# Patient Record
Sex: Female | Born: 1980 | Race: Black or African American | Hispanic: No | Marital: Single | Smoking: Never smoker
Health system: Southern US, Community
[De-identification: ages and names within clinical notes are randomized; demographics above are authoritative.]

## PROBLEM LIST (undated history)

## (undated) DIAGNOSIS — K219 Gastro-esophageal reflux disease without esophagitis: Secondary | ICD-10-CM

## (undated) DIAGNOSIS — K603 Anal fistula, unspecified: Secondary | ICD-10-CM

## (undated) DIAGNOSIS — F32A Depression, unspecified: Secondary | ICD-10-CM

## (undated) DIAGNOSIS — R7611 Nonspecific reaction to tuberculin skin test without active tuberculosis: Secondary | ICD-10-CM

## (undated) DIAGNOSIS — F329 Major depressive disorder, single episode, unspecified: Secondary | ICD-10-CM

## (undated) DIAGNOSIS — K5090 Crohn's disease, unspecified, without complications: Secondary | ICD-10-CM

## (undated) DIAGNOSIS — D649 Anemia, unspecified: Secondary | ICD-10-CM

## (undated) DIAGNOSIS — R011 Cardiac murmur, unspecified: Secondary | ICD-10-CM

## (undated) DIAGNOSIS — F419 Anxiety disorder, unspecified: Secondary | ICD-10-CM

## (undated) DIAGNOSIS — Z5189 Encounter for other specified aftercare: Secondary | ICD-10-CM

## (undated) HISTORY — DX: Anal fistula, unspecified: K60.30

## (undated) HISTORY — DX: Depression, unspecified: F32.A

## (undated) HISTORY — DX: Anxiety disorder, unspecified: F41.9

## (undated) HISTORY — DX: Cardiac murmur, unspecified: R01.1

## (undated) HISTORY — DX: Major depressive disorder, single episode, unspecified: F32.9

## (undated) HISTORY — DX: Anal fistula: K60.3

## (undated) HISTORY — PX: OTHER SURGICAL HISTORY: SHX169

## (undated) HISTORY — DX: Anemia, unspecified: D64.9

## (undated) HISTORY — PX: COLONOSCOPY: SHX174

## (undated) HISTORY — DX: Gastro-esophageal reflux disease without esophagitis: K21.9

## (undated) HISTORY — DX: Encounter for other specified aftercare: Z51.89

## (undated) HISTORY — PX: ILEOSTOMY: SHX1783

## (undated) HISTORY — DX: Nonspecific reaction to tuberculin skin test without active tuberculosis: R76.11

## (undated) HISTORY — DX: Crohn's disease, unspecified, without complications: K50.90

## (undated) HISTORY — PX: DILATION AND CURETTAGE OF UTERUS: SHX78

---

## 2013-08-09 ENCOUNTER — Encounter: Payer: Self-pay | Admitting: Internal Medicine

## 2013-09-27 ENCOUNTER — Encounter: Payer: Self-pay | Admitting: Internal Medicine

## 2013-10-01 ENCOUNTER — Telehealth: Payer: Self-pay | Admitting: Internal Medicine

## 2013-10-01 ENCOUNTER — Ambulatory Visit (INDEPENDENT_AMBULATORY_CARE_PROVIDER_SITE_OTHER): Payer: BC Managed Care – PPO | Admitting: Internal Medicine

## 2013-10-01 ENCOUNTER — Encounter: Payer: Self-pay | Admitting: Internal Medicine

## 2013-10-01 ENCOUNTER — Ambulatory Visit (INDEPENDENT_AMBULATORY_CARE_PROVIDER_SITE_OTHER): Payer: BC Managed Care – PPO

## 2013-10-01 VITALS — BP 90/76 | HR 84 | Ht 65.0 in | Wt 118.2 lb

## 2013-10-01 DIAGNOSIS — K5090 Crohn's disease, unspecified, without complications: Secondary | ICD-10-CM

## 2013-10-01 DIAGNOSIS — Z9889 Other specified postprocedural states: Secondary | ICD-10-CM

## 2013-10-01 DIAGNOSIS — K5010 Crohn's disease of large intestine without complications: Secondary | ICD-10-CM

## 2013-10-01 DIAGNOSIS — Z9049 Acquired absence of other specified parts of digestive tract: Secondary | ICD-10-CM | POA: Insufficient documentation

## 2013-10-01 DIAGNOSIS — F064 Anxiety disorder due to known physiological condition: Secondary | ICD-10-CM

## 2013-10-01 DIAGNOSIS — F0630 Mood disorder due to known physiological condition, unspecified: Secondary | ICD-10-CM | POA: Insufficient documentation

## 2013-10-01 DIAGNOSIS — K50113 Crohn's disease of large intestine with fistula: Secondary | ICD-10-CM | POA: Insufficient documentation

## 2013-10-01 DIAGNOSIS — K632 Fistula of intestine: Secondary | ICD-10-CM

## 2013-10-01 LAB — CBC WITH DIFFERENTIAL/PLATELET
BASOS PCT: 0.2 % (ref 0.0–3.0)
Basophils Absolute: 0 10*3/uL (ref 0.0–0.1)
Eosinophils Absolute: 0 10*3/uL (ref 0.0–0.7)
Eosinophils Relative: 0.6 % (ref 0.0–5.0)
HCT: 34.4 % — ABNORMAL LOW (ref 36.0–46.0)
HEMOGLOBIN: 12.1 g/dL (ref 12.0–15.0)
LYMPHS ABS: 2 10*3/uL (ref 0.7–4.0)
LYMPHS PCT: 32.4 % (ref 12.0–46.0)
MCHC: 35.1 g/dL (ref 30.0–36.0)
MCV: 78.9 fl (ref 78.0–100.0)
MONOS PCT: 3.1 % (ref 3.0–12.0)
Monocytes Absolute: 0.2 10*3/uL (ref 0.1–1.0)
Neutro Abs: 3.9 10*3/uL (ref 1.4–7.7)
Neutrophils Relative %: 63.7 % (ref 43.0–77.0)
Platelets: 329 10*3/uL (ref 150.0–400.0)
RBC: 4.35 Mil/uL (ref 3.87–5.11)
RDW: 16.2 % — AB (ref 11.5–15.5)
WBC: 6.1 10*3/uL (ref 4.0–10.5)

## 2013-10-01 LAB — COMPREHENSIVE METABOLIC PANEL
ALT: 28 U/L (ref 0–35)
AST: 27 U/L (ref 0–37)
Albumin: 4.2 g/dL (ref 3.5–5.2)
Alkaline Phosphatase: 92 U/L (ref 39–117)
BILIRUBIN TOTAL: 0.5 mg/dL (ref 0.2–1.2)
BUN: 9 mg/dL (ref 6–23)
CALCIUM: 9.7 mg/dL (ref 8.4–10.5)
CHLORIDE: 106 meq/L (ref 96–112)
CO2: 24 mEq/L (ref 19–32)
Creatinine, Ser: 0.6 mg/dL (ref 0.4–1.2)
GFR: 150.59 mL/min (ref >60.00)
Glucose, Bld: 85 mg/dL (ref 70–99)
Potassium: 3.7 mEq/L (ref 3.5–5.1)
Sodium: 138 mEq/L (ref 135–145)
Total Protein: 7.9 g/dL (ref 6.0–8.3)

## 2013-10-01 LAB — IRON: IRON: 45 ug/dL (ref 42–145)

## 2013-10-01 LAB — FERRITIN: Ferritin: 14.8 ng/mL (ref 10.0–291.0)

## 2013-10-01 LAB — HIGH SENSITIVITY CRP: CRP, High Sensitivity: 2.38 mg/L (ref 0.000–5.000)

## 2013-10-01 NOTE — Patient Instructions (Signed)
Your physician has requested that you go to the basement for lab work before leaving today  You have a follow up appointment with Dr. Rhea Belton on 10/31/2013 at 9:15am

## 2013-10-01 NOTE — Progress Notes (Signed)
Patient ID: Tracy Solis Vanderpool, female   DOB: May 06, 1980, 33 y.o.   MRN: [id number] HPI: Tracy Solis Ficken is a 33 yo female with PMH of Crohn's colitis with perianal involvement, anxiety depression who is being seen for the first time today. She is referred for consultation at the request of Dr. Donnetta HutchingJulie Thacker to establish care regarding Crohn's disease. The patient is here alone today. She had a laparoscopic total proctocolectomy with permanent end ileostomy on 07/31/2013. She has a long-standing history of colitis dating back approximately 14 years and she was [facility].  Her colitis through the years has been difficult to control and she has taken Remicade which stopped in August 2014 due to loss of response, and then she was started on Humira which she was taking until February 2015. She reportedly had a surveillance colonoscopy which revealed a DALM and she was referred to colorectal surgery. Initially she [place] felt she probably had Crohn's disease. This was difficult for the patient to process and she got a second opinion with Dr. Donnet[facility] at [facility]. Dr. Recardo Evangelisthacker's impression was also that of Crohn's with perirectal disease and she recommended total proctocolectomy but let her know that IPAA was not an option.  The patient took several months to process this but eventually went for surgery in April as previously mentioned. She was in hospital for 2 days and discharged home. She had post surgery followup with Dr. Abigail Miyamotohacker in mid May 2015.  Today the patient reports that she is doing okay. She was quite tearful several times during her encounter. She freely admits that she's had issues with both anxiety and depression regarding finding out that she had Crohn's versus ulcerative colitis and that she has had to have a major surgery and now has an end ileostomy. She is quite down on the fact that Crohn's disease is not curable, as she once  thought her ulcerative colitis could be. She denies SI or HI. She has been started on citalopram, dose unknown and she is also using 0.25 mg of alprazolam 3 times daily.  She reports on 2 occasions she's had some peristomal bleeding but this resolved spontaneously. She reports she did not have much blood loss. She did hold pressure. The [facility].  Her ostomy output has been nonbloody otherwise. She has some soreness around her ostomy but reports she is doing well with her ostomy care. She's had no perianal pain or drainage. Appetite has not fully recovered though she is eating one to 2 meals a day. She denies fevers or chills. She does have some nausea but no vomiting. She is using Aleve for the soreness at her ostomy.  She does report she has 2 children, ages 7915 and 617 months. She reports she is not married and has recently separated from the father of her children  Past Medical History  Diagnosis Date  . Crohn disease   . Anxiety   . Anemia   . Anal fistula   . Depression     Past Surgical History  Procedure Laterality Date  . Ileostomy    . Dilation and curettage of uterus    . Mrsa surgery      Current Outpatient Prescriptions  Medication Sig Dispense Refill  . ALPRAZolam (XANAX) 0.25 MG tablet Take 0.25 mg by mouth 3 (three) times daily as needed for anxiety or sleep.      . Iron-Vitamins (GERITOL COMPLETE PO) Take 1  tablet by mouth daily.      . Naproxen Sodium (ALEVE) 220 MG CAPS Take by mouth as needed.        No current facility-administered medications for this visit.    Allergies  Allergen Reactions  . Penicillins     Family History  Problem Relation Age of Onset  . Ovarian cancer Maternal Grandmother   . GER disease Mother   . Diabetes Maternal Uncle     History  Substance Use Topics  . Smoking status: Never Smoker   . Smokeless tobacco: Never Used  . Alcohol Use: Yes     Comment: social    ROS: As per history of present illness,  otherwise negative  BP 90/76  Pulse 84  Ht 5\' 5"  (1.651 m)  Wt 118 lb 4 oz (53.638 kg)  BMI 19.68 kg/m2  LMP 09/13/2013 Constitutional: Well-developed and well-nourished. No distress. HEENT: Normocephalic and atraumatic. Oropharynx is clear and moist. No oropharyngeal exudate. Conjunctivae are normal.  No scleral icterus. Neck: Neck supple. Trachea midline. Cardiovascular: Normal rate, regular rhythm and intact distal pulses. No M/R/G Pulmonary/chest: Effort normal and breath sounds normal. No wheezing, rales or rhonchi. Abdominal: Soft, nontender, nondistended. Bowel sounds active throughout. Ostomy in the right lower quadrant without pain to palpation. No hepatosplenomegaly. Extremities: no clubbing, cyanosis, or edema Rectal: Internal exam not performed due to previous surgery, there is a fistulous opening noted posterior laterally, without fluctuance or any drainage, nontender Lymphadenopathy: No cervical adenopathy noted. Neurological: Alert and oriented to person place and time. Skin: Skin is warm and dry. No rashes noted. Psychiatric: Normal mood and affect. Behavior is normal.  Operative note taken from care everywhere --  SURGERY DATE: 07/31/2013  PRE-OP DIAGNOSIS: Colitis [558.9]   POST-OP DIAGNOSIS: Colitis [558.9]  PROCEDURE/S: LAPAROSCOPIC TOTAL PROCTOCOLECTOMY, END ILEOSTOMY  SURGEON: Ned Card, MD, Colorectal Surgery Attending  ASSISTANT(S):  Synetta Fail, MD, [facility] SAR  ANESTHESIA: General   POSITION: lithotomy.  ERAS: yes  WOUND LENGTH: stoma site, 3 cm  WOUND CLASSIFICATION: clean contaminated.  OPERATIVE FINDINGS: severe proctocolitis throughout entire colon and rectum. Rectosigmoid and transverse colon with segmentally worse disease. The rectum was very thick, as was the mesorectum, complicating laparoscopic approach, however, this was accomplished down to the anus. The distal rectum had a stricture to about 57mm at 3cm from the anal verge. 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   OPERATIVE REPORT: After being identified and consented in the preoperative holding space, Ms. Falu had an epidural placed. IV antibiotics and VTE prophylaxis was given as documented in the record.  In the OR, the patient was again identified and consented. Once general anesthesia was begun, the patient was repositioned in lithotomy position with arms padded and tucked. The abdomen and perinuem were prepped and draped in the usual sterile fashion, and a time out was performed.  The veress needle was through a 8mm LUQ incision to insufflate the peritoneum to 22mm Hg. Working ports were placed. All of these were 109mm except for the stoma site which was 42mm and the suprapubic site was enlarged to allow the 45mm fan retractor for her recently gravid, floppy uterus and adnexa.  The colon was mobilized from the sigmoid colon up to and through the splenic flexure to meet the right sided mobilization. The IMA and IMV were transected with care to avoid the left ureter. The mesentery was taken with a combination of stapled and heat source ligation.  The right colon  was mobilized from the small bowel mesenteric attachments to the retroperitoneum up to and through full visualization of the C-loop of the duodenum and head of the pancreas. The ileocolic and middle colic vessels were identified. The ileocolic vessel was taken by high ligation at the inferior aspect of the duodenum with the right ureter and duodenum in full visualization. The distal ileum was transected.  The rectum was resected by TME technique down to the pelvic floor. Care was taken to stay in the relatively avascular space around the rectum and to avoid the critical structures and nerves of the pelvis. The distal rectum was transected from the anus at one cm. The specimen was extracorporealized via the planned stoma site with the use of wound protector.  Proximal ileum was brought  out through the RLQ stoma site. The 12mm suprapubic site was closed with the endoclose. The 5mm skin sites were closed with biosyn and indermil. The stoma was matured in [place] fashion.  ESTIMATED BLOOD LOSS: 50 cc  SPECIMENS: terminal ileum, appendix, entire abdominal colon and rectum, leaving only a rectal stump of 1-2cm.  COMPLICATIONS: none  DISPOSITION: PACU - hemodynamically stable.  ATTESTATION:    ASSESSMENT/PLAN: 33 year old female with Crohn's colitis with perianal involvement status post total proctocolectomy with permanent end ileostomy on 07/31/2013 who is seen to establish care.  1.  Crohn's colitis and perianal Crohn's -- we had a long discussion today regarding the diagnosis of Crohn's disease. She is still coming to grips with the fact that this disease was not cured forever by surgery. We did discuss how Dr. Abigail Miyamoto has removed the bulk, if not all of her active disease to this point. I have recommended reinitiation of therapy to prevent recurrence of Crohn's disease, particularly in the postsurgical state. Emotionally, she is not ready for this today. Again, she was quite tearful, but I do think she is processing our discussion. We talked for greater than 30 minutes about Crohn's disease and further treatment. She has previously taken Remicade and Humira. I feel that if and when we reinitiate biologic therapy, Cimzia, would be a good choice for her.  She does not like giving herself injections, but she did okay with it.  Cimzia would be less frequent than Humira. I would like to give her more time to consider therapy, and I'll see her back in 4 weeks to discuss things further. I will check labs today to include CMP, CBC, CRP, iron studies. I will check hepatitis B and C. serology as well as TB test with quantiferon gold.    2.  Anxiety and depression -- I asked that she call me with the current dose of citalopram. We'll consider increasing the dose depending on her current dose.  She will continue Xanax 3 times daily for now, but I would like to wean this off in favor of the SSRI over time. She understands this recommendation  Return in 4 weeks, sooner if necessary

## 2013-10-01 NOTE — Telephone Encounter (Signed)
Patient reports that she forgot to mention that she also has chest pain- epigastric that mainly she feels in the pm.  She would like to talk with you about this.  I have updated her med list with celexa dosage.

## 2013-10-02 ENCOUNTER — Telehealth: Payer: Self-pay | Admitting: Gastroenterology

## 2013-10-02 LAB — COMPREHENSIVE METABOLIC PANEL WITH GFR
ALT: 26 U/L (ref 0–35)
AST: 22 U/L (ref 0–37)
Albumin: 4.1 g/dL (ref 3.5–5.2)
Alkaline Phosphatase: 102 U/L (ref 39–117)
BUN: 9 mg/dL (ref 6–23)
CO2: 14 meq/L — ABNORMAL LOW (ref 19–32)
Calcium: 9.9 mg/dL (ref 8.4–10.5)
Chloride: 106 meq/L (ref 96–112)
Creat: 0.66 mg/dL (ref 0.50–1.10)
Glucose, Bld: 73 mg/dL (ref 70–99)
Potassium: 4.5 meq/L (ref 3.5–5.3)
Sodium: 139 meq/L (ref 135–145)
Total Bilirubin: 0.4 mg/dL (ref 0.2–1.2)
Total Protein: 7.2 g/dL (ref 6.0–8.3)

## 2013-10-02 LAB — HEPATITIS C ANTIBODY: HCV AB: NEGATIVE

## 2013-10-02 LAB — C-REACTIVE PROTEIN

## 2013-10-02 LAB — HEPATITIS B CORE ANTIBODY, TOTAL: Hep B Core Total Ab: NONREACTIVE

## 2013-10-02 LAB — FERRITIN: Ferritin: 17 ng/mL (ref 10–291)

## 2013-10-02 LAB — HEPATITIS B SURFACE ANTIBODY,QUALITATIVE: HEP B S AB: NEGATIVE

## 2013-10-02 LAB — IRON: Iron: 37 ug/dL — ABNORMAL LOW (ref 42–145)

## 2013-10-02 MED ORDER — PANTOPRAZOLE SODIUM 40 MG PO TBEC: 40.0000 mg | DELAYED_RELEASE_TABLET | Freq: Every day | ORAL | Status: DC

## 2013-10-02 MED ORDER — SUCRALFATE 1 GM/10ML PO SUSP: 1.0000 g | Freq: Four times a day (QID) | ORAL | Status: DC

## 2013-10-02 MED ORDER — CITALOPRAM HYDROBROMIDE 10 MG PO TABS: 20.0000 mg | ORAL_TABLET | Freq: Every day | ORAL | Status: DC

## 2013-10-02 NOTE — Telephone Encounter (Signed)
Message copied by Richardo HanksHOWALD, Aldona Bryner A on Tue Oct 02, 2013  8:51 AM ------      Message from: Beverley FiedlerPYRTLE, JAY M      Created: Mon Oct 01, 2013 12:51 PM       Please fax note to Donnetta [facility]Julie [facility]acker, MD at [facility]      I need [facility] after colon surgery in April 2015, and I need office note from Dr. Recardo Evangelisthacker's post-op visit in May 2015 ------

## 2013-10-02 NOTE — Telephone Encounter (Signed)
Sent in medications to pt's pharmacy. Pharmacy to notify pt when Rx's are ready.

## 2013-10-02 NOTE — Telephone Encounter (Signed)
Spoke to patient by phone Please increase citalopram to 20 mg daily Separate issue which she failed to discuss yesterday is substernal chest discomfort which feels like a pressure. This is worse in the evenings. Located between the breasts and radiates upward to her throat. Nonexertional. No shortness of breath. She has tried [place] and over-the-counter Prevacid without much benefit. No dysphagia or odynophagia Trial of pantoprazole 40 mg daily, 30 minutes before first meal of the day Sucralfate liquid 1 g 2-3 times daily before meals and at bedtime Office visit in one month as previously recommended

## 2013-10-02 NOTE — Telephone Encounter (Signed)
Sent Dr. Sherald Barge office note to Dr. Nils Flack office. Requested colon surgery path and post op visit notes from April and May 2015

## 2013-10-24 ENCOUNTER — Telehealth: Payer: Self-pay | Admitting: Internal Medicine

## 2013-10-24 NOTE — Telephone Encounter (Signed)
Left message for patient to call back  

## 2013-10-24 NOTE — Telephone Encounter (Signed)
Discussed with the patient that she is having rectal seepage of mucus from her rectum.  She does have a 2 cm rectal vault left from surgery.  I explained to her that not abnormal for discharge from rectum.  Discussed with Dr. Rhea Belton, she is to be considering resuming biologic therapy and will discuss with her further at follow up next week.  She is advised of response and she will discuss with Dr. Rhea Belton next week abut starting Cimzia.

## 2013-10-31 ENCOUNTER — Ambulatory Visit (INDEPENDENT_AMBULATORY_CARE_PROVIDER_SITE_OTHER): Payer: BC Managed Care – PPO | Admitting: Internal Medicine

## 2013-10-31 ENCOUNTER — Encounter: Payer: Self-pay | Admitting: *Deleted

## 2013-10-31 ENCOUNTER — Encounter: Payer: Self-pay | Admitting: Internal Medicine

## 2013-10-31 VITALS — BP 90/70 | HR 76 | Ht 65.0 in | Wt 113.4 lb

## 2013-10-31 DIAGNOSIS — K219 Gastro-esophageal reflux disease without esophagitis: Secondary | ICD-10-CM | POA: Insufficient documentation

## 2013-10-31 DIAGNOSIS — K5010 Crohn's disease of large intestine without complications: Secondary | ICD-10-CM

## 2013-10-31 DIAGNOSIS — K50918 Crohn's disease, unspecified, with other complication: Secondary | ICD-10-CM

## 2013-10-31 DIAGNOSIS — K5090 Crohn's disease, unspecified, without complications: Secondary | ICD-10-CM

## 2013-10-31 DIAGNOSIS — F064 Anxiety disorder due to known physiological condition: Secondary | ICD-10-CM

## 2013-10-31 DIAGNOSIS — Z23 Encounter for immunization: Secondary | ICD-10-CM

## 2013-10-31 DIAGNOSIS — K50113 Crohn's disease of large intestine with fistula: Secondary | ICD-10-CM

## 2013-10-31 DIAGNOSIS — D509 Iron deficiency anemia, unspecified: Secondary | ICD-10-CM | POA: Insufficient documentation

## 2013-10-31 DIAGNOSIS — K632 Fistula of intestine: Secondary | ICD-10-CM

## 2013-10-31 MED ORDER — FAMOTIDINE 20 MG PO TABS: 20.0000 mg | ORAL_TABLET | Freq: Two times a day (BID) | ORAL | Status: DC

## 2013-10-31 NOTE — Progress Notes (Signed)
Subjective:    Patient ID: Tracy Solis, female    DOB: 01-Jan-1981, 33 y.o.   MRN: [id number]  HPI Tracy Solis is a 33 year old female with Crohn's colitis with perianal involvement status post laparoscopic total proctocolectomy with permanent end ileostomy in April 2015 who is seen in followup. She was last seen on 10/01/2013. She reports she is doing some better today and has thought more and come to clearer realization that she has Crohn's disease rather than colitis and that this is chronic. She was given a prescription for pantoprazole and surgical site for heartburn and epigastric dyspeptic symptoms which helped significantly. These symptoms are intermittent and she would prefer not to take pantoprazole daily. She denies trouble swallowing. Appetite has fluctuated. She has been under a lot of stress and reports she recently separated from her long-term boyfriend who is the father of her children. This has been difficult for her. She is working but got approved for [employer] when necessary. She is having scant mucus per rectum which can be blood-tinged but this is rare. She denies severe anxiety and depression including SI and HI. Ostomy is working well with non-bloody output.   Review of Systems As per history of present illness, otherwise negative  Current Medications, Allergies, Past Medical History, Past Surgical History, Family History and Social History were reviewed in [employer] record.     Objective:   Physical Exam BP 90/70  Pulse 76  Ht 5\' 5"  (1.651 m)  Wt 113 lb 6 oz (51.427 kg)  BMI 18.87 kg/m2  LMP 10/14/2013 Constitutional: Well-developed and well-nourished. No distress. HEENT: Normocephalic and atraumatic. Oropharynx is clear and moist. No oropharyngeal exudate. Conjunctivae are normal.  No scleral icterus. Neck: Neck supple. Trachea midline. Cardiovascular: Normal rate, regular rhythm and intact distal pulses. No M/R/G Pulmonary/chest:  Effort normal and breath sounds normal. No wheezing, rales or rhonchi. Abdominal: Soft, nontender, nondistended. Bowel sounds active throughout. Ostomy right lower quadrant Extremities: no clubbing, cyanosis, or edema Lymphadenopathy: No cervical adenopathy noted. Neurological: Alert and oriented to person place and time. Skin: Skin is warm and dry. No rashes noted. Psychiatric: Normal mood and affect. Behavior is normal.  CBC    Component Value Date/Time   WBC 6.1 10/01/2013 1016   RBC 4.35 10/01/2013 1016   HGB 12.1 10/01/2013 1016   HCT 34.4* 10/01/2013 1016   PLT 329.0 Result may be falsely decreased due to platelet clumping. 10/01/2013 1016   MCV 78.9 10/01/2013 1016   MCHC 35.1 10/01/2013 1016   RDW 16.2* 10/01/2013 1016   LYMPHSABS 2.0 10/01/2013 1016   MONOABS 0.2 10/01/2013 1016   EOSABS 0.0 10/01/2013 1016   BASOSABS 0.0 10/01/2013 1016    CMP     Component Value Date/Time   NA 138 10/01/2013 1016   NA 139 10/01/2013 1016   K 3.7 10/01/2013 1016   K 4.5 10/01/2013 1016   CL 106 10/01/2013 1016   CL 106 10/01/2013 1016   CO2 24 10/01/2013 1016   CO2 14* 10/01/2013 1016   GLUCOSE 85 10/01/2013 1016   GLUCOSE 73 10/01/2013 1016   BUN 9 10/01/2013 1016   BUN 9 10/01/2013 1016   CREATININE 0.66 10/01/2013 1016   CREATININE 0.6 10/01/2013 1016   CALCIUM 9.7 10/01/2013 1016   CALCIUM 9.9 10/01/2013 1016   PROT 7.9 10/01/2013 1016   PROT 7.2 10/01/2013 1016   ALBUMIN 4.2 10/01/2013 1016   ALBUMIN 4.1 10/01/2013 1016   AST 27 10/01/2013 1016  AST 22 10/01/2013 1016   ALT 28 10/01/2013 1016   ALT 26 10/01/2013 1016   ALKPHOS 92 10/01/2013 1016   ALKPHOS 102 10/01/2013 1016   BILITOT 0.5 10/01/2013 1016   BILITOT 0.4 10/01/2013 1016    Hep B, C serology neg  Iron/TIBC/Ferritin/ %Sat    Component Value Date/Time   IRON 45 10/01/2013 1016   IRON 37* 10/01/2013 1016   FERRITIN 14.8 10/01/2013 1016   FERRITIN 17 10/01/2013 1016   hsCRP 2.4     Assessment & Plan:  [age] year old female with  Crohn's colitis with perianal involvement status post laparoscopic total proctocolectomy with permanent end ileostomy in April 2015 who is seen in followup.   1. Crohn's colitis with perianal disease -- overall she seems better today than she was last month. She does continue to deal with social stressors and the breakup of a long-term relationship. I have again recommended reinitiation of biologic therapy to prevent disease recurrence in the small bowel or perianal area. We discussed maintenance therapy including immunomodulators and biologics.  We discussed the risks in great detail including the risk of infection (including reactivation of latent TB and underlying viral hepatitis), hepatotoxicity, malignancy (specifically lymphoma), demyelinating disease, rash, and even heart failure. She has taken Remicade and Humira in the past. We need to check quantiferon gold and if negative will plan to start Cimzia with induction and plan dosing every 4 weeks thereafter. She reports she is comfortable with this plan and wants to do everything possible to avoid recurrence. --Hepatitis A/B vaccine today along with Pneumovax --Followup in 8-10 weeks  2. Iron deficiency anemia -- I recommended oral iron with Fusion Plus, and if this is poorly tolerated will recommend IV iron  3.  GERD and dyspepsia -- it is understandable that she does not want daily therapy and her symptoms are intermittent. We'll plan Pepcid 20 mg every 12 hours as needed for heartburn and dyspepsia. Discontinue pantoprazole and sucralfate.  4. Anxiety and depression -- tolerating citalopram 20 mg daily and using alprazolam. I recommended that she strongly consider referral to psychology and she will think more about this. She will also let me know if symptoms worsen

## 2013-10-31 NOTE — Patient Instructions (Addendum)
We are giving you a Hep A/B vaccine today We are giving you a Pneumovax today We are sending you in a prescription today( Pepcid) Cimzia start  Discontinue Protonix Discontinue Carafate Please come back in 7 days to have your next twinrix vaccine Scheduled on 11/07/2013 11:30am We are giving you Fusion plus samples Use one a day If you can take the samples please call for a prescription after you complete the samples

## 2013-11-02 ENCOUNTER — Other Ambulatory Visit: Payer: Self-pay

## 2013-11-02 MED ORDER — CERTOLIZUMAB PEGOL 6 X 200 MG/ML ~~LOC~~ KIT: PACK | SUBCUTANEOUS | Status: DC

## 2013-11-02 MED ORDER — CERTOLIZUMAB PEGOL 2 X 200 MG/ML ~~LOC~~ KIT: PACK | SUBCUTANEOUS | Status: DC

## 2013-11-02 NOTE — Progress Notes (Signed)
Patient notified that the Cimzia has been approved.  Rx must be filled with [facility].  New RX sent to [phone]  PA # [id number] approved 11/01/13-11/02/15

## 2013-11-07 ENCOUNTER — Ambulatory Visit (INDEPENDENT_AMBULATORY_CARE_PROVIDER_SITE_OTHER): Payer: BC Managed Care – PPO | Admitting: Internal Medicine

## 2013-11-07 DIAGNOSIS — K5090 Crohn's disease, unspecified, without complications: Secondary | ICD-10-CM

## 2013-11-07 DIAGNOSIS — K50918 Crohn's disease, unspecified, with other complication: Secondary | ICD-10-CM

## 2013-11-07 DIAGNOSIS — Z23 Encounter for immunization: Secondary | ICD-10-CM

## 2013-11-15 ENCOUNTER — Telehealth: Payer: Self-pay | Admitting: Internal Medicine

## 2013-11-15 NOTE — Telephone Encounter (Signed)
I spoke with the patient she will reschedule her injection and come for the quantiferon Gold testing and I will call her with the results.  She understands not to take the first injection until she has heard from us.

## 2013-11-15 NOTE — Telephone Encounter (Signed)
I spoke with the patient this am.  She feels she is not able to inject herself with Cimzia, home health nurse that came out today told her she could not inject her that the patient had to do it herself.   I called and spoke with Kelli Hope at [facility] program [phone].  Patient must be able to inject themselves or a family member must do it they have the Cimzia pre-filled syringes.  The nurse can only do the injections if it is the lyosalized powder.  Patient has the starter kit at home for the pre-filled syringe.  Her husband will give her the injections with the [facility] nurse this pm.  For the weeks of 2 and week 4 she will bring the drug and come here for the injections.  I spoke with [facility] pharmacy and have changed her maintenance Cimzia doses to the lyosalized powder.  [facility] will arrange for a [facility] nurse to go to the home monthly for her maintenance injections.  The patient verbalized understanding of all the above and is in agreement.     Upon further review of chart patient has not completed quatiferon Gold TB testing.  I have left a message for her to call back to discuss

## 2013-11-19 ENCOUNTER — Other Ambulatory Visit (INDEPENDENT_AMBULATORY_CARE_PROVIDER_SITE_OTHER): Payer: BC Managed Care – PPO

## 2013-11-19 DIAGNOSIS — K5090 Crohn's disease, unspecified, without complications: Secondary | ICD-10-CM

## 2013-11-19 DIAGNOSIS — K50918 Crohn's disease, unspecified, with other complication: Secondary | ICD-10-CM

## 2013-11-19 LAB — VITAMIN B12: Vitamin B-12: 472 pg/mL (ref 211–911)

## 2013-11-19 LAB — VITAMIN D 25 HYDROXY (VIT D DEFICIENCY, FRACTURES): VITD: 15.68 ng/mL — ABNORMAL LOW (ref 30.00–100.00)

## 2013-11-19 LAB — HEPATITIS B SURFACE ANTIGEN: Hepatitis B Surface Ag: NEGATIVE

## 2013-11-19 NOTE — Telephone Encounter (Signed)
I have left a message for the patient reminding her to come for lab work

## 2013-11-20 ENCOUNTER — Other Ambulatory Visit: Payer: Self-pay

## 2013-11-20 MED ORDER — VITAMIN D (ERGOCALCIFEROL) 1.25 MG (50000 UNIT) PO CAPS: 50000.0000 [IU] | ORAL_CAPSULE | ORAL | Status: DC

## 2013-11-21 LAB — QUANTIFERON TB GOLD ASSAY (BLOOD)
INTERFERON GAMMA RELEASE ASSAY: NEGATIVE
Mitogen value: 1 IU/mL
QUANTIFERON NIL VALUE: 0.05 [IU]/mL
QUANTIFERON TB AG MINUS NIL: 0 [IU]/mL
TB AG VALUE: 0.04 [IU]/mL

## 2013-11-23 ENCOUNTER — Telehealth: Payer: Self-pay | Admitting: Internal Medicine

## 2013-11-23 NOTE — Telephone Encounter (Signed)
Spoke with patient and she reports she has a hx of osteopenia. She has been having severe left leg pain not relieved by Mortin for 3-4 days. She does not have a PCP. Instructed patient to have this evaluated at urgent care or ED.

## 2013-12-07 ENCOUNTER — Telehealth: Payer: Self-pay | Admitting: *Deleted

## 2013-12-07 NOTE — Telephone Encounter (Signed)
Aliana with Cimzia nurse program called stating that patient told them she does not need their services because she was already given an injection in our office. I am unable to see any documentation that we gave an injection. Do you know anything about this, Sheri? Please call Aliana back at [phone] ext [phone].

## 2013-12-10 NOTE — Telephone Encounter (Signed)
Left message for patient to call back.  Patient was to come for injection here.  I asked her to call back so we can discuss

## 2013-12-10 NOTE — Telephone Encounter (Signed)
Patient returned call.  She has not started [place].  She developed an infection in her leg and has been on antibiotics.  She was advised by the prime care MD to wait to start Cimzia until she finished the antibiotics.  She still has some minor swelling in her leg.  Dr. Rhea Belton are you ok with her starting on Cimzia?  She does not have a primary care and has been seeing an urgent care.  Please advise

## 2013-12-10 NOTE — Telephone Encounter (Signed)
Would like to see medical records regarding her "leg infection" to see exactly what was being treated for making decision on initiation of biologic Certainly I would like her to have no pain, redness, fevers, chills before starting

## 2013-12-10 NOTE — Telephone Encounter (Signed)
Left message for patient to call back  

## 2013-12-11 NOTE — Telephone Encounter (Signed)
Records are in your box.  Please advise if ok to start Cimzia

## 2013-12-11 NOTE — Telephone Encounter (Signed)
I spoke with [facility] and she will send over the notes.  She was treated for a hip bursitis.  She was started on Voltaren and not antibiotics.

## 2013-12-11 NOTE — Telephone Encounter (Signed)
Patient was seen at [facility] on [address] in [place] [phone].  I have left a message to get the records faxed to [place]

## 2013-12-11 NOTE — Telephone Encounter (Signed)
Left message for patient to call back  

## 2013-12-12 NOTE — Telephone Encounter (Signed)
Records from urgent care reviewed, left hip bursitis No evidence of infection, fever or infectious process Prescribed diclofenac Okay to begin Cimzia with induction at this time. Please remind patient these injections need to be taken on time without skipping doses Would use diclofenac extremely sparingly as NSAIDs can worsen Crohn's disease

## 2013-12-13 NOTE — Telephone Encounter (Signed)
I spoke with the patient she will come here for her first 3 Cimzia injections here, injection #1 tomorrow,  injection #2 on 12/27/13 at 11, and get her 3rd injection at the office visit on 01/10/14.  She is aware she needs to bring her dose of Cimzia with her.   I have a message in to [place] with the  [facility] program [phone] to arrange for her to receive monthly injections via home health nurse with the LYO powder starting 02/09/14.

## 2013-12-13 NOTE — Telephone Encounter (Signed)
I spoke with Aliana from the [facility] program, they will arrange for her to receive her 1st maintenance injection of Cimzia of 02/09/14.

## 2013-12-13 NOTE — Telephone Encounter (Signed)
Left message for patient to call back  

## 2013-12-14 NOTE — Telephone Encounter (Signed)
Patient called this am and cancelled her appt today for the cimzia injection.  She reported to [place], [facility] her father gave her the injection last night.

## 2013-12-17 ENCOUNTER — Telehealth: Payer: Self-pay | Admitting: Internal Medicine

## 2013-12-17 NOTE — Telephone Encounter (Signed)
Pt states that her father gave her Cimzia injection to her Thursday night. Pt thinks she had a reaction to Cimzia, states she woke up Friday am with a swollen area under her left eye that was red and itchy. States that on both of her arms she has a red rash that itches very bad. Pt states she took benadryl every 4-6 hours and she has been bathing in Aveeno. Pt wants to know what she needs to do, she is concerned that the Cimzia caused this. Please advise.

## 2013-12-17 NOTE — Telephone Encounter (Signed)
Pt scheduled to see Doug Sou PA 12/21/13@2 :30pm. Pt aware of appt.

## 2013-12-17 NOTE — Telephone Encounter (Signed)
Pt called back and states she doesn't think the rash will still be present on Friday with her taking benadryl and doing the aveeno baths. Pt doesn't think it would be worth her coming up here on Friday when the 1st available APP appt is open. Please advise.

## 2013-12-17 NOTE — Telephone Encounter (Signed)
Not convinced this is a cimzia reaction, would advise APP visit to look at rash

## 2013-12-17 NOTE — Telephone Encounter (Signed)
Have her call on Thursday to update [place] on her rash, otherwise she should be seen as recommended

## 2013-12-18 NOTE — Telephone Encounter (Signed)
Pt aware.

## 2013-12-21 ENCOUNTER — Ambulatory Visit: Payer: BC Managed Care – PPO | Admitting: Gastroenterology

## 2014-01-01 ENCOUNTER — Ambulatory Visit: Payer: BC Managed Care – PPO | Admitting: Internal Medicine

## 2014-01-10 ENCOUNTER — Ambulatory Visit: Payer: BC Managed Care – PPO | Admitting: Internal Medicine

## 2014-01-22 ENCOUNTER — Telehealth: Payer: Self-pay | Admitting: Internal Medicine

## 2014-01-22 NOTE — Telephone Encounter (Signed)
Spoke with pt and she states the output is normal and there is no blood. Pt states she will contact Dr. Abigail Miyamotohacker.

## 2014-01-22 NOTE — Telephone Encounter (Signed)
Is she having normal output of stool via the stoma?  Is there any bleeding? I do recommend she contact Dr. Abigail Miyamoto, her surgeon and be seen locally if not improving.

## 2014-01-22 NOTE — Telephone Encounter (Signed)
Spoke with pt and she states that starting about 6:30am today she started having a sharp stabbing pain in her stoma about every 10-1015min and it lasts about 10-15 seconds. Pt wants to know what she should do, if she should contact her surgeon ([facility]). Please advise.

## 2014-02-12 ENCOUNTER — Telehealth: Payer: Self-pay | Admitting: Internal Medicine

## 2014-02-12 NOTE — Telephone Encounter (Signed)
Left message to call back.  Confirmed contact phone number information in chart.

## 2014-03-07 ENCOUNTER — Encounter: Payer: Self-pay | Admitting: Internal Medicine

## 2014-03-07 ENCOUNTER — Ambulatory Visit (INDEPENDENT_AMBULATORY_CARE_PROVIDER_SITE_OTHER)
Admission: RE | Admit: 2014-03-07 | Discharge: 2014-03-07 | Disposition: A | Discharge status: Home / Self Care | Payer: BC Managed Care – PPO | Source: Ambulatory Visit | Attending: Internal Medicine | Admitting: Internal Medicine

## 2014-03-07 ENCOUNTER — Ambulatory Visit (INDEPENDENT_AMBULATORY_CARE_PROVIDER_SITE_OTHER): Payer: BC Managed Care – PPO | Admitting: Internal Medicine

## 2014-03-07 VITALS — BP 80/60 | HR 68 | Ht 65.0 in | Wt 119.8 lb

## 2014-03-07 DIAGNOSIS — K5010 Crohn's disease of large intestine without complications: Secondary | ICD-10-CM

## 2014-03-07 DIAGNOSIS — Z79899 Other long term (current) drug therapy: Secondary | ICD-10-CM

## 2014-03-07 DIAGNOSIS — R079 Chest pain, unspecified: Secondary | ICD-10-CM

## 2014-03-07 DIAGNOSIS — Z23 Encounter for immunization: Secondary | ICD-10-CM

## 2014-03-07 DIAGNOSIS — K219 Gastro-esophageal reflux disease without esophagitis: Secondary | ICD-10-CM

## 2014-03-07 DIAGNOSIS — K50113 Crohn's disease of large intestine with fistula: Secondary | ICD-10-CM

## 2014-03-07 NOTE — Patient Instructions (Addendum)
Please call your Cimzia nurse to restart your cimzia injections.  Please purchase the following medications over the counter and take as directed: Cerave lotion-use for itchy eczema  Please restart your Protonix 40 mg daily x 2-4 weeks.   Please continue your carafate.  Please go to the basement floor to xray for your chest xray before leaving today.  Your physician has requested that you go to the basement for the following lab work before leaving today: CBC, CMP  We have given you a flu vaccine today.  Please follow up with Dr Rhea BeltonPyrtle in 3-4 months.

## 2014-03-07 NOTE — Progress Notes (Signed)
Subjective:    Patient ID: Tracy Solis, female    DOB: January 30, 1981, 33 y.o.   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 She was last seen on 10/31/2013. Since this time she has been started on Cimzia and has completed induction. There is been issues with having the nurse come out to inject her medication and so her last dose was in late September. She is due for this injection now. She did have eczematous type rash on her upper arms and chest as well as neck. She was unsure if this was related to cimzia or not.  No blisters. No fevers or chills. She used Aveeno as a moisturizer and had a cortisone cream which helped significantly. No rash currently. Ostomy is working well without blood in her ostomy output. No peristomal pain. No perianal pain discomfort or drainage. She does have significant amount of stress in her life and she thinks this is taking a toll on her overall. For the last 2 weeks she's had considerable heartburn. She is off pantoprazole though she recently started back liquid Carafate. It hasn't seem to help too much. No dysphagia. No shortness of breath or dyspnea on exertion. She does report the burning in her chest is slightly worse with deep inspiration.   Review of Systems As per history of present illness, otherwise negative  Current Medications, Allergies, Past Medical History, Past Surgical History, Family History and Social History were reviewed in [employer] record.     Objective:   Physical Exam BP 80/60 mmHg  Pulse 68  Ht 5\' 5"  (1.651 m)  Wt 119 lb 12.8 oz (54.341 kg)  BMI 19.94 kg/m2  LMP 03/06/2014 Constitutional: Well-developed and well-nourished. No distress. HEENT: Normocephalic and atraumatic. Oropharynx is clear and moist. No oropharyngeal exudate.  Conjunctivae are normal.  No scleral icterus. Neck: Neck supple. Trachea midline. Cardiovascular: Normal rate, regular rhythm and intact distal pulses. No M/R/G Pulmonary/chest: Effort normal and breath sounds normal. No wheezing, rales or rhonchi. Abdominal: Soft, nontender, nondistended. Bowel sounds active throughout. There are no masses palpable. Stoma in place in the right lower quadrant without pain induration or redness. Extremities: no clubbing, cyanosis, or edema Lymphadenopathy: No cervical adenopathy noted. Neurological: Alert and oriented to person place and time. Skin: Skin is warm and dry. No rashes noted. Psychiatric: Normal mood and affect. Behavior is normal.  CBC    Component Value Date/Time   WBC 6.1 10/01/2013 1016   RBC 4.35 10/01/2013 1016   HGB 12.1 10/01/2013 1016   HCT 34.4* 10/01/2013 1016   PLT  10/01/2013 1016    329.0 Result may be falsely decreased due to platelet clumping.   MCV 78.9 10/01/2013 1016   MCHC 35.1 10/01/2013 1016   RDW 16.2* 10/01/2013 1016   LYMPHSABS 2.0 10/01/2013 1016   MONOABS 0.2 10/01/2013 1016   EOSABS 0.0 10/01/2013 1016   BASOSABS 0.0 10/01/2013 1016    CMP     Component Value Date/Time   NA 138 10/01/2013 1016   NA 139 10/01/2013 1016   K 3.7 10/01/2013 1016   K 4.5 10/01/2013 1016   CL 106 10/01/2013 1016   CL 106 10/01/2013 1016   CO2 24 10/01/2013 1016   CO2 14* 10/01/2013 1016   GLUCOSE 85 10/01/2013 1016   GLUCOSE 73 10/01/2013 1016   BUN 9  10/01/2013 1016   BUN 9 10/01/2013 1016   CREATININE 0.6 10/01/2013 1016   CREATININE 0.66 10/01/2013 1016   CALCIUM 9.7 10/01/2013 1016   CALCIUM 9.9 10/01/2013 1016   PROT 7.9 10/01/2013 1016   PROT 7.2 10/01/2013 1016   ALBUMIN 4.2 10/01/2013 1016   ALBUMIN 4.1 10/01/2013 1016   AST 27 10/01/2013 1016   AST 22 10/01/2013 1016   ALT 28 10/01/2013 1016   ALT 26 10/01/2013 1016   ALKPHOS 92 10/01/2013 1016   ALKPHOS 102 10/01/2013 1016   BILITOT 0.5 10/01/2013  1016   BILITOT 0.4 10/01/2013 1016   hsCRP 2.4 (last check)    Assessment & Plan:  33 year old female with Crohn's colitis with perianal involvement 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  1. Crohn's colitis with perianal involvement -- overall she is doing better though has been off of her biologic therapy for approximately 2 months, indicating she is 4 weeks overdue. We will resume Cimzia 400 mg every 4 weeks and will send the home health nurse to inject her medication every 4 weeks. She prefers to have help with her injection. No evidence of recurrent Crohn's and we discussed using the biologic medication to prevent recurrence. Perianal disease has healed to this point. --continue Cimzia as above --Flu shot today --return in 3-4 months --CBC, CMP today  2. Rash -- most consistent with eczema and not felt secondary to complication of biologic therapy. I recommended moisturizing cream in the form of CeraVe twice daily to the affected area. If rash continues will pursue dermatology referral  3. Heartburn -- chest discomfort most consistent with heartburn. She has been off pantoprazole. I recommended she restart this medication 40 mg daily for 2-4 weeks. She can also continue liquid Carafate before meals and at bedtime for the next 2-4 weeks. After 2-4 weeks she will try to discontinue PPI if symptoms have abated. If not have asked that she call me. She voices understanding.

## 2014-03-08 ENCOUNTER — Telehealth: Payer: Self-pay | Admitting: Internal Medicine

## 2014-03-11 NOTE — Telephone Encounter (Signed)
Pt returned call - [phone]

## 2014-03-11 NOTE — Telephone Encounter (Signed)
Left message for patient to call back  

## 2014-03-11 NOTE — Telephone Encounter (Signed)
done

## 2014-03-11 NOTE — Telephone Encounter (Signed)
I have spoken to patient. She states that her supervisor tells her she already has 2 points against her as FMLA was not used previously. Supervisor suggests she ask for 3-4 days monthly so she does not get additional points. She also states that she needs more GERD medication as she has lost previous rx (thinks her toddler may have thown it away). I advised that she does have refills. She will call the pharmacy.

## 2014-03-11 NOTE — Telephone Encounter (Signed)
Dottie I have the paperwork Please see what details she is speaking of. She wanted her work to have documentation of her Crohn's should future disease activity affect her work

## 2014-03-11 NOTE — Telephone Encounter (Signed)
I have left message advising patient the FMLA forms have been filled out by Dr Rhea BeltonPyrtle.

## 2014-03-28 ENCOUNTER — Encounter: Payer: Self-pay | Admitting: Internal Medicine

## 2014-03-29 ENCOUNTER — Telehealth: Payer: Self-pay | Admitting: Internal Medicine

## 2014-03-29 NOTE — Telephone Encounter (Signed)
Patient states that she needs our office to call for her to be able to get nurse to come out for Cimzia. She would also like Dr Rhea Belton to fill out paperwork again for FMLA. Her work states that the previous times off given were "excessive" and they suggested he write for 3-4 occurrences and 1-2 days a month.

## 2014-03-29 NOTE — Telephone Encounter (Signed)
Left message for pt to call back  °

## 2014-04-02 NOTE — Telephone Encounter (Signed)
Dr Rhea Belton- Patient indicates that she has not taken Cimzia since September. Per your last office note, she is to resume Cimzia every 4 weeks. Am I correct that you are okay with me restarting at only maintanence dosing rather than reinitiating the medication?

## 2014-04-02 NOTE — Telephone Encounter (Signed)
Dottie have you called regarding the Cimzia? See note below.

## 2014-04-02 NOTE — Telephone Encounter (Signed)
Yes, she needs to resume ASAP.  Okay to start without induction.  Plan every 4 week dosing regimen Will need RN assistance at home for injections

## 2014-04-02 NOTE — Telephone Encounter (Signed)
Left message for patient to call back. It appears that nurse from [facility] has tried to contact her multiple times (see 02/12/14 note) with no response. She needs to reach out to them.

## 2014-04-03 NOTE — Telephone Encounter (Signed)
Information sent to [facility] again requesting home health nurse and medication for maintanence dosing. I have left a message asking patient to answer calls in the near future even if she does not know the number as it may be nurse from [facility] calling.

## 2014-04-08 NOTE — Telephone Encounter (Signed)
We have received a call from [name] with [facility].  She states that a home health nurse has already gone out to see the patient and insurance will only cover 1 time for nurse to come out with prefilled syringes. Insurance WILL cover nurse to come out for the lyophilized powder form of the medication though. I have spoken to Dr Rhea BeltonPyrtle who prefers patient go on powder form of medication so a nurse can administer the medication. I have also spoken to the patient. She does not have any prefilled syringes on hand. I explained Dr Lauro FranklinPyrtle's recommendations to patient and she verbalizes understanding. A new referral has been sent to [facility].

## 2014-04-09 ENCOUNTER — Telehealth: Payer: Self-pay | Admitting: Internal Medicine

## 2014-04-09 ENCOUNTER — Encounter: Payer: Self-pay | Admitting: Internal Medicine

## 2014-04-09 NOTE — Telephone Encounter (Signed)
Dottie do you know anything about this. 

## 2014-04-09 NOTE — Telephone Encounter (Signed)
Patient states that the start and end date must also be completed before [employer] will accept. Per Dr Rhea Belton, start date is first date patient was seen (10/01/13) and end date should read "indefinate" as this is a lifelong disease. I have updated information and have sent it to [employer]. I have also advised patient of this.

## 2014-04-09 NOTE — Telephone Encounter (Signed)
Error

## 2014-04-10 NOTE — Telephone Encounter (Signed)
/  Kathie Rhodes from [facility] called back and states that she got prior auth for the cimzia lyophilized powder through [facility] good through 11/02/15. PA number is [id number]. However, they will not let her give prescription. I called [facility] and gave verbal rx for the cimzia lyophilized powder 400 mg SQ every 4 weeks with 6 refills. Per Kathie Rhodes, a nurse is already assigned to go out to see Ms.Mollenkopf.

## 2014-04-17 ENCOUNTER — Telehealth: Payer: Self-pay | Admitting: Internal Medicine

## 2014-04-17 NOTE — Telephone Encounter (Signed)
Spoke with Denny Peon and gave her verbal order for home health to give Cimzia injections every 4 weeks.

## 2014-04-17 NOTE — Telephone Encounter (Signed)
I have spoken to [place] at [facility]. Rene KocherRegina has given verbal order that it is okay to assist with cimzia injection (CMA's are unable to do this. Only an Charity fundraiserN or LPN is able to do this). I have sent over the original order as well as progress [facility] to [facility] per her request. Denny Peonrin states that they are just waiting on the patient to hear [facility]. I contacted [facility] who states that there is a paid claim for the cimzia powder and that they actually reached out to the patient today and left a message for her to contact them for shipment of the medicine.

## 2014-04-29 ENCOUNTER — Telehealth: Payer: Self-pay | Admitting: Internal Medicine

## 2014-04-29 NOTE — Telephone Encounter (Signed)
Dr Rhea Belton- I contacted [facility] to provide ICD 10 code for Cimizia. I explained that I was a bit confused by the fact that I am now giving an ICD 10 code after I received information on 04/17/14 (see phone note) that her Cimzia powder showed a paid claim, had been approved through insurance and they were waiting on a call back from patient. Apparently, [facility] not [facility] until TODAY, 2 weeks later. Her insurance coverage has now lapsed so all of the nurse referrals for cimzia administration, the prior approval and medication itself are no longer good. I have spent numerous hours on this (on multiple occasions) and am starting to become unsure I am going to get anywhere with this.

## 2014-05-09 ENCOUNTER — Telehealth: Payer: Self-pay | Admitting: Internal Medicine

## 2014-05-09 NOTE — Telephone Encounter (Signed)
I have spoken to patient and have advised that I have spent multiple hours getting her Cimzia set up previously. It was ready to be shipped and had a paid claim through [facility] but order lapsed since patient did not call until 2-3 weeks later (see previous documentation). I explained that I am more than happy to help her get her medication but I need to make sure that we are all on the same page and serious about getting this done. She states that she was never told anything was ready to be shipped and states that she was supposed to have a supervisor to call her back from [facility] but nobody ever did. I will work on this once more on Monday when I return to the office.

## 2014-05-13 NOTE — Telephone Encounter (Signed)
I contacted [facility] to figure out what is going on with patient's medication. I was told that patient's rx was cancelled (for the 3rd time) because she either was "no longer on medication therapy or requested to have another pharmacy to get medication from." Representative could not tell me when patient told them this. I asked to speak to a supervisor who would have this information since the patient tells me that she never told them this. While on hold, I was hung up on. I will call back tomorrow.

## 2014-05-14 NOTE — Telephone Encounter (Signed)
I contacted [facility] and spoken to [place] (phone [phone]). She states that patient's rx appears to have been sent to another division of [facility] that her insurance requires they use. She states that she has restarted patient's account. However, they will now need to get insurance verification again from the patient. I attempted to contact patient x 2 while I had [place]ri[place] on the phone and was unsuccessful. I have left a message that patient needs to call [facility] back with current insurance information.

## 2014-05-16 NOTE — Telephone Encounter (Signed)
Left message for patient to call back to let me know if she has contacted [facility] with her current insurance information.

## 2014-05-17 ENCOUNTER — Telehealth: Payer: Self-pay | Admitting: Internal Medicine

## 2014-05-17 NOTE — Telephone Encounter (Signed)
See phone note dated 05/17/14. Patient's meds are being processed.

## 2014-05-17 NOTE — Telephone Encounter (Signed)
Awesome news. If she needs anything further, she can give [place] a call.

## 2014-05-20 ENCOUNTER — Telehealth: Payer: Self-pay | Admitting: Internal Medicine

## 2014-05-20 NOTE — Telephone Encounter (Signed)
Left message for patient to call back  

## 2014-05-21 NOTE — Telephone Encounter (Signed)
Left message for patient to call back  

## 2014-05-21 NOTE — Telephone Encounter (Signed)
Patient states that insurance requires she use another pharmacy now instead of [facility]. Their number is [phone]. I will contact them tomorrow morning.

## 2014-05-22 MED ORDER — CERTOLIZUMAB PEGOL 2 X 200 MG ~~LOC~~ KIT: PACK | SUBCUTANEOUS | Status: DC

## 2014-05-22 NOTE — Telephone Encounter (Signed)
I have spoken to Katrina @ [facility] who has taken verbal orders for Cimzia lyophilized powder starter kit and maintenance dose. She will go ahead and initiate medication in the system. I have advised patient of this.

## 2014-05-24 NOTE — Telephone Encounter (Signed)
Tracy Solis the Rx has NOT contacted her yet. Says she will call them when she gets out of work around 5pm today.

## 2014-05-24 NOTE — Telephone Encounter (Signed)
Left message for patient to call back to let me know if she has heard from [facility] yet.

## 2014-05-24 NOTE — Telephone Encounter (Signed)
Per Maralyn Sago at [facility], they have been attempting to contact patient to let her know medication is ready to be shipped out. However, they had the wrong phone number in their system. Maralyn Sago has updated the system and I have called patient back to let her know that she may want to go ahead and contact [facility] to get shipment going. She states that she will do this.

## 2014-05-27 NOTE — Telephone Encounter (Signed)
Patient will let us know when cimzia is delievered. She states that she already spoke to [place], the home health nurse that will administer the cimzia and she states that she will come out as soon as the medication is received.

## 2014-05-27 NOTE — Telephone Encounter (Signed)
I have contacted [facility] pharmacy. Patient's cimzia has been sent out and is scheduled for delivery tomorrow.

## 2014-06-03 ENCOUNTER — Telehealth: Payer: Self-pay | Admitting: Internal Medicine

## 2014-06-03 NOTE — Telephone Encounter (Signed)
Noted  

## 2014-06-04 ENCOUNTER — Telehealth: Payer: Self-pay

## 2014-06-04 NOTE — Telephone Encounter (Signed)
Fleet Contras, nurse from [facility], called and states pts Cimzia has come in and she needs orders to administer the Cimzia. Verbal order given over the phone to start the Cimzia.

## 2014-08-02 ENCOUNTER — Telehealth: Payer: Self-pay | Admitting: Internal Medicine

## 2014-08-02 NOTE — Telephone Encounter (Signed)
Spoke with Fleet Contras and gave orders over the phone for continued home health for injections.

## 2014-09-17 ENCOUNTER — Ambulatory Visit: Payer: Self-pay | Admitting: Internal Medicine

## 2014-10-14 ENCOUNTER — Other Ambulatory Visit: Payer: Self-pay | Admitting: Internal Medicine

## 2014-10-25 ENCOUNTER — Ambulatory Visit (INDEPENDENT_AMBULATORY_CARE_PROVIDER_SITE_OTHER): Payer: BLUE CROSS/BLUE SHIELD | Admitting: Internal Medicine

## 2014-10-25 ENCOUNTER — Other Ambulatory Visit (INDEPENDENT_AMBULATORY_CARE_PROVIDER_SITE_OTHER): Payer: BLUE CROSS/BLUE SHIELD

## 2014-10-25 ENCOUNTER — Encounter: Payer: Self-pay | Admitting: Internal Medicine

## 2014-10-25 VITALS — BP 92/60 | HR 64 | Ht 65.0 in | Wt 124.5 lb

## 2014-10-25 DIAGNOSIS — K5090 Crohn's disease, unspecified, without complications: Secondary | ICD-10-CM | POA: Diagnosis not present

## 2014-10-25 DIAGNOSIS — Z79899 Other long term (current) drug therapy: Secondary | ICD-10-CM

## 2014-10-25 DIAGNOSIS — K5010 Crohn's disease of large intestine without complications: Secondary | ICD-10-CM

## 2014-10-25 DIAGNOSIS — Z932 Ileostomy status: Secondary | ICD-10-CM | POA: Diagnosis not present

## 2014-10-25 DIAGNOSIS — E559 Vitamin D deficiency, unspecified: Secondary | ICD-10-CM

## 2014-10-25 DIAGNOSIS — R21 Rash and other nonspecific skin eruption: Secondary | ICD-10-CM

## 2014-10-25 LAB — COMPREHENSIVE METABOLIC PANEL
ALBUMIN: 4.3 g/dL (ref 3.5–5.2)
ALK PHOS: 67 U/L (ref 39–117)
ALT: 14 U/L (ref 0–35)
AST: 17 U/L (ref 0–37)
BUN: 12 mg/dL (ref 6–23)
CO2: 26 mEq/L (ref 19–32)
Calcium: 9.5 mg/dL (ref 8.4–10.5)
Chloride: 106 mEq/L (ref 96–112)
Creatinine, Ser: 0.61 mg/dL (ref 0.40–1.20)
GFR: 143.99 mL/min (ref >60.00)
Glucose, Bld: 75 mg/dL (ref 70–99)
POTASSIUM: 3.9 meq/L (ref 3.5–5.1)
Sodium: 140 mEq/L (ref 135–145)
TOTAL PROTEIN: 7.7 g/dL (ref 6.0–8.3)
Total Bilirubin: 0.4 mg/dL (ref 0.2–1.2)

## 2014-10-25 LAB — HIGH SENSITIVITY CRP: CRP HIGH SENSITIVITY: 0.83 mg/L (ref 0.000–5.000)

## 2014-10-25 LAB — CBC WITH DIFFERENTIAL/PLATELET
Basophils Absolute: 0 10*3/uL (ref 0.0–0.1)
Basophils Relative: 0.4 % (ref 0.0–3.0)
Eosinophils Absolute: 0.2 10*3/uL (ref 0.0–0.7)
Eosinophils Relative: 2.9 % (ref 0.0–5.0)
HCT: 39.3 % (ref 36.0–46.0)
Hemoglobin: 12.8 g/dL (ref 12.0–15.0)
LYMPHS ABS: 2 10*3/uL (ref 0.7–4.0)
Lymphocytes Relative: 36 % (ref 12.0–46.0)
MCHC: 32.6 g/dL (ref 30.0–36.0)
MCV: 82.4 fl (ref 78.0–100.0)
MONOS PCT: 5.4 % (ref 3.0–12.0)
Monocytes Absolute: 0.3 10*3/uL (ref 0.1–1.0)
NEUTROS ABS: 3.1 10*3/uL (ref 1.4–7.7)
Neutrophils Relative %: 55.3 % (ref 43.0–77.0)
PLATELETS: 308 10*3/uL (ref 150.0–400.0)
RBC: 4.77 Mil/uL (ref 3.87–5.11)
RDW: 15.7 % — ABNORMAL HIGH (ref 11.5–15.5)
WBC: 5.6 10*3/uL (ref 4.0–10.5)

## 2014-10-25 LAB — VITAMIN B12: Vitamin B-12: 390 pg/mL (ref 211–911)

## 2014-10-25 LAB — VITAMIN D 25 HYDROXY (VIT D DEFICIENCY, FRACTURES): VITD: 10.77 ng/mL — AB (ref 30.00–100.00)

## 2014-10-25 NOTE — Patient Instructions (Signed)
Go to the basement for labs today Follow up in 6 months Continue Cimzia We will contact you with your referral appointment to [facility]

## 2014-10-25 NOTE — Progress Notes (Signed)
Subjective:    Patient ID: Tracy Solis, female    DOB: 12/09/1980, 34 y.o.   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 She was last seen here in clinic on 03/07/2014. At that time she was restarted on Cimzia 400 mg every 4 weeks. She has been getting this without problem by home health nurse who is performing the injection for her. She reports she has been troubled with some hair thinning but she denies seeing her hair fall out with brushing or combing. This does bother her. She's having very rare episodic contraction/cramping type pain around her ostomy. This last attended 15 seconds. Occurs less than once per month but did occur several times over a 2 day. About 3 weeks ago. This scared her a little bit but resolved on its own. No blood in the ostomy bag. No melena. She denies abdominal pain. She is dealing with a rash which is quite itchy in her bilateral antecubital cubital fossa which seems to be worse after her Cimzia injection. No heartburn she is off pantoprazole Carafate and famotidine. She doesn't recall taking vitamin D supplementation last year but feels that if it was prescribed she took it. Reports she feels well mentally denies anxiety and depression. Is sleeping well. Her 2 yo son, Tracy Solis, is with her today.  She is working. Denies being sexually active at this time. She is having regular periods. She stopped her birth control because she doesn't like the way it makes her feel and hopes to go back on Depo-Provera  Review of Systems As per history of present illness, otherwise negative   Current Medications, Allergies, Past Medical History, Past Surgical History, Family History and Social History were reviewed in [employer] record.     Objective:   Physical Exam BP 92/60 mmHg   Pulse 64  Ht  (1.651 m)  Wt 124 lb 8 oz (56.473 kg)  BMI 20.72 kg/m2  LMP 10/24/2014 Constitutional: Well-developed and well-nourished. No distress. HEENT: Normocephalic and atraumatic. Oropharynx is clear and moist. No oropharyngeal exudate. Conjunctivae are normal.  No scleral icterus. Neck: Neck supple. Trachea midline. Cardiovascular: Normal rate, regular rhythm and intact distal pulses. No M/R/G Pulmonary/chest: Effort normal and breath sounds normal. No wheezing, rales or rhonchi. Abdominal: Soft, nontender, nondistended. Bowel sounds active throughout. There are no masses palpable. No hepatosplenomegaly. Ostomy nontender. Extremities: no clubbing, cyanosis, or edema Lymphadenopathy: No cervical adenopathy noted. Neurological: Alert and oriented to person place and time. Skin: Skin is warm and dry. Raised dry somewhat scaly papular rash in the bilateral antecubital space Psychiatric: Normal mood and affect. Behavior is normal.  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   1. Crohn's colitis with perianal involvement -- status post proctocolectomy with end ileostomy. Doing well on Cimzia. Continue Cimzia 400 mg every 4 weeks. Annual flu shot advised. CBC, CMP, CRP, B12 and vitamin D today.  2. Antecubital rash -- dermatology referral to [facility], query if this is Cimzia related. Await their opinion  3. Heartburn -- no longer an issue and off medication  4. Vitamin D deficiency -- previously high-dose supplementation, I'm not sure she took it. Repeat vitamin D level today  Return in 6-9 months, sooner if necessary

## 2014-10-29 ENCOUNTER — Other Ambulatory Visit: Payer: Self-pay

## 2014-10-29 MED ORDER — VITAMIN D3 1.25 MG (50000 UT) PO TABS: 50000.0000 [IU] | ORAL_TABLET | ORAL | Status: DC

## 2014-11-01 ENCOUNTER — Telehealth: Payer: Self-pay | Admitting: *Deleted

## 2014-11-01 NOTE — Telephone Encounter (Signed)
Patient will receive a map and new patient paperwork in the mail before her appointment  L/M for patient of her appointment date and time to [facility] she will see Lurena Joiner Smith,MD on 01/02/2015 at 10:45am

## 2014-11-11 ENCOUNTER — Telehealth: Payer: Self-pay | Admitting: *Deleted

## 2014-11-11 DIAGNOSIS — K50919 Crohn's disease, unspecified, with unspecified complications: Secondary | ICD-10-CM

## 2014-11-11 NOTE — Telephone Encounter (Signed)
I have spoken to patient to request that she have TB gold quantiferon in August. I have entered lab for week of August 1st. Patient states that she will come for this. I have also advised patient of dermatology appointment on 01/02/15 @ 10:45 am. She verbalizes understanding.

## 2014-11-12 ENCOUNTER — Telehealth: Payer: Self-pay | Admitting: Internal Medicine

## 2014-11-12 NOTE — Telephone Encounter (Signed)
Patient reports that rash on her arms is spreading, she is miserable and does not want to continue on the Cimzia anymore if rash is related to it.  Hydrocortisone is not helping rash, it is painful and itching.  I spoke with [facility] at [facility] [phone] and there are no earlier appts with any MD prior to 12/24/14 appt she has scheduled.  Please advise

## 2014-11-12 NOTE — Telephone Encounter (Signed)
Would hold cimzia until appt with derm If rash worsening please see if private dermatology appt available sooner in [facility] Thanks [employer]

## 2014-11-13 NOTE — Telephone Encounter (Signed)
Pt aware.

## 2014-12-27 ENCOUNTER — Telehealth: Payer: Self-pay | Admitting: *Deleted

## 2014-12-27 NOTE — Telephone Encounter (Signed)
[facility] sent d/c orders to our office for Cimzia since patient is unable to continue taking it due to rash. Dr Rhea Belton indicated that he would like patient to have an office visit. I have left a voicemail for patient to call back. She is currently scheduled 02/27/15.

## 2014-12-31 NOTE — Telephone Encounter (Signed)
I have spoken to patient and have given appointment information. I actually found a sooner appointment for 02/14/15 so she will come for that. Patient wonders if she still needs dermatology appointment at [facility] since her rash has disappeared. Patient also would like Dr Rhea Belton to know that her stoma pain has recurred and is a "sharp, piercing pain". There is no abnormal drainage but she has noticed some bleeding from the site. Please advise GQ:[id number] appt and abdominal pain

## 2014-12-31 NOTE — Telephone Encounter (Signed)
Can cancel dermatology given rash resolution after stopping Cimzia Please place allergy rash with Cimzia Stoma pain should be evaluated, please schedule to see Tracy Solis We'll discuss further treatment regarding biologic therapy when she sees me as scheduled

## 2015-01-01 NOTE — Telephone Encounter (Signed)
Left message for patient to call back  

## 2015-01-01 NOTE — Telephone Encounter (Signed)
I have spoken to patient to advise that she may cancel her dermatology appointment since her rash is gone. I have also scheduled patient for an appointment with Amy at her first available appointment 01/10/15. Patient verbalizes understanding and also understands to keep her appointment with Dr Rhea Belton for discussion of other biologics.

## 2015-01-10 ENCOUNTER — Ambulatory Visit (INDEPENDENT_AMBULATORY_CARE_PROVIDER_SITE_OTHER): Payer: BLUE CROSS/BLUE SHIELD | Admitting: Physician Assistant

## 2015-01-10 ENCOUNTER — Encounter: Payer: Self-pay | Admitting: Physician Assistant

## 2015-01-10 ENCOUNTER — Other Ambulatory Visit (INDEPENDENT_AMBULATORY_CARE_PROVIDER_SITE_OTHER): Payer: BLUE CROSS/BLUE SHIELD

## 2015-01-10 VITALS — BP 118/60 | HR 72 | Ht 65.0 in | Wt 129.6 lb

## 2015-01-10 DIAGNOSIS — K50819 Crohn's disease of both small and large intestine with unspecified complications: Secondary | ICD-10-CM

## 2015-01-10 DIAGNOSIS — R52 Pain, unspecified: Secondary | ICD-10-CM

## 2015-01-10 DIAGNOSIS — K9413 Enterostomy malfunction: Secondary | ICD-10-CM | POA: Diagnosis not present

## 2015-01-10 LAB — CBC WITH DIFFERENTIAL/PLATELET
BASOS PCT: 0.2 % (ref 0.0–3.0)
Basophils Absolute: 0 10*3/uL (ref 0.0–0.1)
EOS PCT: 1.1 % (ref 0.0–5.0)
Eosinophils Absolute: 0.1 10*3/uL (ref 0.0–0.7)
HEMATOCRIT: 38.2 % (ref 36.0–46.0)
Hemoglobin: 12.4 g/dL (ref 12.0–15.0)
Lymphocytes Relative: 34.6 % (ref 12.0–46.0)
Lymphs Abs: 2.2 10*3/uL (ref 0.7–4.0)
MCHC: 32.4 g/dL (ref 30.0–36.0)
MCV: 83.1 fl (ref 78.0–100.0)
MONOS PCT: 5 % (ref 3.0–12.0)
Monocytes Absolute: 0.3 10*3/uL (ref 0.1–1.0)
NEUTROS ABS: 3.7 10*3/uL (ref 1.4–7.7)
Neutrophils Relative %: 59.1 % (ref 43.0–77.0)
PLATELETS: 311 10*3/uL (ref 150.0–400.0)
RBC: 4.6 Mil/uL (ref 3.87–5.11)
RDW: 14.3 % (ref 11.5–15.5)
WBC: 6.2 10*3/uL (ref 4.0–10.5)

## 2015-01-10 LAB — BASIC METABOLIC PANEL
BUN: 8 mg/dL (ref 6–23)
CHLORIDE: 107 meq/L (ref 96–112)
CO2: 23 mEq/L (ref 19–32)
Calcium: 9.4 mg/dL (ref 8.4–10.5)
Creatinine, Ser: 0.6 mg/dL (ref 0.40–1.20)
GFR: 146.58 mL/min (ref >60.00)
GLUCOSE: 73 mg/dL (ref 70–99)
Potassium: 3.7 mEq/L (ref 3.5–5.1)
Sodium: 140 mEq/L (ref 135–145)

## 2015-01-10 LAB — HCG, QUANTITATIVE, PREGNANCY: Quantitative HCG: 0.35 m[IU]/mL

## 2015-01-10 LAB — C-REACTIVE PROTEIN: CRP: 0.2 mg/dL — ABNORMAL LOW (ref 0.5–20.0)

## 2015-01-10 MED ORDER — HYDROCODONE-ACETAMINOPHEN 5-325 MG PO TABS: 1.0000 | ORAL_TABLET | Freq: Four times a day (QID) | ORAL | Status: DC | PRN

## 2015-01-10 NOTE — Patient Instructions (Signed)
You have been scheduled for a CT scan of the abdomen and pelvis at [place] ([address]---this is in the same building as Press [employer]).   You are scheduled on 01/15/15 at 3:30 pm. You should arrive 15 minutes prior to your appointment time for registration. Please follow the written instructions below on the day of your exam:  WARNING: IF YOU ARE ALLERGIC TO IODINE/X-RAY DYE, PLEASE NOTIFY RADIOLOGY IMMEDIATELY AT [phone]! YOU WILL BE GIVEN A 13 HOUR PREMEDICATION PREP.  1) Do not eat or drink anything after 11:30 am (4 hours prior to your test) 2) You have been given 2 bottles of oral contrast to drink. The solution may taste better if refrigerated, but do NOT add ice or any other liquid to this solution. Shake well before drinking.    Drink 1 bottle of contrast @ 1:30 pm (2 hours prior to your exam)  Drink 1 bottle of contrast @ 2:30 pm (1 hour prior to your exam)  You may take any medications as prescribed with a small amount of water except for the following: Metformin, Glucophage, Glucovance, Avandamet, Riomet, Fortamet, Actoplus Met, Janumet, Glumetza or Metaglip. The above medications must be held the day of the exam AND 48 hours after the exam.  The purpose of you drinking the oral contrast is to aid in the visualization of your intestinal tract. The contrast solution may cause some diarrhea. Before your exam is started, you will be given a small amount of fluid to drink. Depending on your individual set of symptoms, you may also receive an intravenous injection of x-ray contrast/dye. Plan on being at [facility] for 30 minutes or long, depending on the type of exam you are having performed.  This test typically takes 30-45 minutes to complete.  If you have any questions regarding your exam or if you need to reschedule, you may call the CT department at [phone] between the hours of 8:00 am and 5:00 pm,  Monday-Friday.  ________________________________________________________________________  Your physician has requested that you go to the basement for lab work before leaving today.  We have sent the following medications to your pharmacy for you to pick up at your convenience: Vicodin 5/325 mg every 4-6 hours as needed for pain.

## 2015-01-10 NOTE — Progress Notes (Addendum)
Patient ID: Tracy Solis, female   DOB: 10-12-1980, 34 y.o.   MRN: [id number]   Subjective:    Patient ID: Tracy Solis, female    DOB: 07-Mar-1981, 34 y.o.   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 She has been on  Cimzia over the past year. Earlier this summer she developed a rash and had also been bothered by thinning hair. She was in the process of being referred to dermatology at [facility] and was holding symptoms he area in the interim her rash resolved. Dermatology workup was not pursued thereafter. And at this time she is on treatment. Plan is to initiate another biologic. Patient comes in today with complaints of stomal pain. She says she has had some periodic episodes of brief sharp pain down in her ileostomy ever since her surgery. She says these are sharp intense pains that last 10-15 seconds and were occurring infrequently. Now over the past couple of months has had a significant increase. She says on days that she has the pain she will have it multiple times during that day, and often stays in bed curled up on her side. She says the pain seems to be associated with a pulling in of the stoma She has been using Vicodin on a when necessary basis. She has also noticed over the past few months that she has had some intermittent oozing of some blood from around the ileostomy. She has not noticed any change in her stool or her stool consistency and has not noticed any blood in the stool. She has no complaints of abdominal pain but says that she is always tender in her abdomen and has been for a long time.  Review of Systems Pertinent positive and negative review of systems were noted in the above HPI section.  All other review of systems was otherwise negative.  Outpatient Encounter  Prescriptions as of 01/10/2015  Medication Sig  . ALPRAZolam (XANAX) 0.25 MG tablet Take 0.25 mg by mouth 3 (three) times daily as needed for anxiety or sleep.  . Cholecalciferol (VITAMIN D3) 50000 UNITS TABS Take 50,000 Units by mouth every 7 (seven) days.  . citalopram (CELEXA) 10 MG tablet Take 2 tablets (20 mg total) by mouth daily.  . Iron-Vitamins (GERITOL COMPLETE PO) Take 1 tablet by mouth daily.  . Naproxen Sodium (ALEVE) 220 MG CAPS Take by mouth as needed.   Marland Kitchen HYDROcodone-acetaminophen (NORCO/VICODIN) 5-325 MG per tablet Take 1 tablet by mouth every 6 (six) hours as needed for moderate pain.   No facility-administered encounter medications on file as of 01/10/2015.   Allergies  Allergen Reactions  . Penicillins   . Cimzia [Certolizumab Pegol] Rash   Patient Active Problem List   Diagnosis Date Noted  . Gastroesophageal reflux disease without esophagitis 10/31/2013  . IDA (iron deficiency anemia) 10/31/2013  . Crohn's disease of colon with fistula 10/01/2013  . Perianal Crohn's disease 10/01/2013  . Status post laparoscopic colectomy 10/01/2013  . Anxiety disorder due to medical condition 10/01/2013  . 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 Marital Status: Single    Spouse Name: N/A  . Number of Children: 2  . Years of Education: N/A   Occupational History  . customer service [employer]   Social History Main Topics  . Smoking status: Never Smoker   . Smokeless tobacco: Never Used  . Alcohol Use: Yes     Comment: social  . Drug Use: No  . Sexual Activity: Not on file   Other Topics Concern  . Not on file   Social History Narrative    Ms. Lawyer's family history includes Diabetes in her maternal uncle; GER disease in her mother; Ovarian cancer in her maternal grandmother.      Objective:    Filed Vitals:   01/10/15 1435  BP: 118/60  Pulse: 72    Physical Exam   well-developed young African-American female in no acute distress, accompanied by her son blood pressure 118/60 pulse 72 height 5 foot 5 weight 129 HEENT; nontraumatic normocephalic EOMI PERRLA sclera anicteric, Cardiovascular; regular rate and rhythm with S1-S2 no murmur rub or gallop, Pulmonary clear bilaterally, Abdomen; soft basically nontender she is very sensitive to touch around her ostomy appliance which she says is skin irritation/sensitivity no obvious hernia, Rectal; exam not done. Neuropsych; mood and affect appropriate       Assessment & Plan:   #1 34 yo female with hx of severe Crohns colitis with perianal disease-s/p laparoscopic total proctocolectomy and end ileostomy 07/2013, with progressive ostomy pain- r/o periostomy hernia, internal hernia, active Crohns #2 rash with Cimzia- now discontinued  Plan; cbc, bmet, crp Ct abd/pelvis   May need ileoscopy if Ct unrevealing  Vicoden 5/325 one q 6 hours prn   She has follow up scheduled next month with Dr Rhea Belton  To discuss biologics etc   Alichia Alridge S Carlota Philley PA-C 01/10/2015   Cc: No ref. provider found  Addendum: Reviewed and agree with ongoing management. If CT shows issue with ostomy will need to see her colorectal surgeon at [facility], [place]

## 2015-01-13 ENCOUNTER — Telehealth: Payer: Self-pay | Admitting: Physician Assistant

## 2015-01-13 NOTE — Telephone Encounter (Signed)
Ok to give vicodin 5/325 ,one Q 6 hours prn #20/0 refills ...dont lose it.

## 2015-01-14 NOTE — Telephone Encounter (Signed)
Left message for pt to call back  °

## 2015-01-15 ENCOUNTER — Ambulatory Visit (INDEPENDENT_AMBULATORY_CARE_PROVIDER_SITE_OTHER)
Admission: RE | Admit: 2015-01-15 | Discharge: 2015-01-15 | Disposition: A | Discharge status: Home / Self Care | Payer: BLUE CROSS/BLUE SHIELD | Source: Ambulatory Visit | Attending: Physician Assistant | Admitting: Physician Assistant

## 2015-01-15 ENCOUNTER — Other Ambulatory Visit: Payer: Self-pay

## 2015-01-15 DIAGNOSIS — K50819 Crohn's disease of both small and large intestine with unspecified complications: Secondary | ICD-10-CM

## 2015-01-15 DIAGNOSIS — R52 Pain, unspecified: Secondary | ICD-10-CM | POA: Diagnosis not present

## 2015-01-15 MED ORDER — IOHEXOL 300 MG/ML  SOLN
100.0000 mL | Freq: Once | INTRAMUSCULAR | Status: AC | PRN
  Administered 2015-01-15: 100 mL via INTRAVENOUS

## 2015-01-15 MED ORDER — HYDROCODONE-ACETAMINOPHEN 5-325 MG PO TABS: 1.0000 | ORAL_TABLET | Freq: Four times a day (QID) | ORAL | Status: AC | PRN

## 2015-01-15 NOTE — Telephone Encounter (Signed)
Patient is aware. She will pick it up today.

## 2015-02-14 ENCOUNTER — Encounter: Payer: Self-pay | Admitting: Internal Medicine

## 2015-02-14 ENCOUNTER — Other Ambulatory Visit: Payer: BLUE CROSS/BLUE SHIELD

## 2015-02-14 ENCOUNTER — Ambulatory Visit (INDEPENDENT_AMBULATORY_CARE_PROVIDER_SITE_OTHER): Payer: BLUE CROSS/BLUE SHIELD | Admitting: Internal Medicine

## 2015-02-14 VITALS — BP 90/62 | HR 76 | Ht 64.0 in | Wt 126.0 lb

## 2015-02-14 DIAGNOSIS — R1900 Intra-abdominal and pelvic swelling, mass and lump, unspecified site: Secondary | ICD-10-CM | POA: Diagnosis not present

## 2015-02-14 DIAGNOSIS — K50919 Crohn's disease, unspecified, with unspecified complications: Secondary | ICD-10-CM | POA: Diagnosis not present

## 2015-02-14 DIAGNOSIS — Z932 Ileostomy status: Secondary | ICD-10-CM | POA: Diagnosis not present

## 2015-02-14 DIAGNOSIS — K219 Gastro-esophageal reflux disease without esophagitis: Secondary | ICD-10-CM | POA: Diagnosis not present

## 2015-02-14 DIAGNOSIS — R21 Rash and other nonspecific skin eruption: Secondary | ICD-10-CM

## 2015-02-14 MED ORDER — RANITIDINE HCL 75 MG PO TABS: 75.0000 mg | ORAL_TABLET | Freq: Two times a day (BID) | ORAL | Status: DC | PRN

## 2015-02-14 NOTE — Patient Instructions (Addendum)
You have been scheduled for an ileoscopy. Please follow written instructions given to you at your visit today. If you use inhalers (even only as needed), please bring them with you on the day of your procedure.  Your physician has requested that you go to the basement for the following lab work before leaving today: TB gold quantiferon  Please contact your GYN for an appointment to evaluate dermoid cyst seen on recent CT.  Please follow up with Dr Rhea Belton in 6 months.  You have been rescheduled to see [facility] [phone]) on Tuesday, 02/18/15 @ 10:30 am with Dr Marylou Flesher. [address] [place] [address]. Bring insurance card, copay and photo ID.   We will get you started on Entyvio.

## 2015-02-14 NOTE — Progress Notes (Signed)
Subjective:    Patient ID: Tracy Solis, female    DOB: [birth year], 34 y.o.   MRN: [id number]  HPI Maylen Hamman is a 34 year old female with past medical history of Crohn's colitis with perianal involvement status post laparoscopic total proctocolectomy with permanent end ileostomy in April 2015 is here for follow-up. She is here today with her son. She was last seen in the office on 01/10/2015 by Mike Gip, PA-C and on 10/25/2014 by me. Last month she was having issues with stomal pain and scant bleeding. Labs were checked which were normal and CT scan was performed of the abdomen and pelvis. This showed no concerning bowel findings and the ostomy looked normal. There was no evidence of parastomal hernia. A pelvic mass, 4.8 cm in size was found felt to be most likely a dermoid.  Today she returns for follow-up. Cimzia was stopped in July because she was having an itchy and scaly rash primarily on her arms bilaterally. The rash seemed to improve the came back about 3 weeks ago despite being off of anti-TNF therapy. Again the rash is located in her bilateral antecubital fossa and is quite pruritic. She's having intermittent crampy type pain near the stoma. Output has been normal. When she changes her appliance, she notices some mild friability at her stoma. This occasionally loses blood but she is not seeing bleeding into her stool and no melena. Appetite has been unchanged. She's having some more issues with GERD and indigestion. No dysphagia or odynophagia. Previously she had been given Carafate she's been using this without benefit.  She is working. Not missing work.   Review of Systems As per history of present illness, otherwise negative  Current Medications, Allergies, Past Medical History, Past Surgical History, Family History and Social History were reviewed in [employer] record.      Objective:   Physical Exam BP 90/62 mmHg  Pulse 76  Ht 5\' 4"  (1.626  m)  Wt 126 lb (57.153 kg)  BMI 21.62 kg/m2  LMP 01/18/2015 Constitutional: Well-developed and well-nourished. No distress. HEENT: Normocephalic and atraumatic. Oropharynx is clear and moist. No oropharyngeal exudate. Conjunctivae are normal.  No scleral icterus. Neck: Neck supple. Trachea midline. Cardiovascular: Normal rate, regular rhythm and intact distal pulses. No M/R/G Pulmonary/chest: Effort normal and breath sounds normal. No wheezing, rales or rhonchi. Abdominal: Soft, nontender, nondistended. Bowel sounds active throughout. Ostomy right lower quadrant without surrounding edema, erythema and is nontender  Extremities: no clubbing, cyanosis, or edema Lymphadenopathy: No cervical adenopathy noted. Neurological: Alert and oriented to person place and time. Skin: Skin is warm and dry. No rashes noted. Psychiatric: Normal mood and affect. Behavior is normal.  CBC    Component Value Date/Time   WBC 6.2 01/10/2015 1527   RBC 4.60 01/10/2015 1527   HGB 12.4 01/10/2015 1527   HCT 38.2 01/10/2015 1527   PLT 311.0 01/10/2015 1527   MCV 83.1 01/10/2015 1527   MCHC 32.4 01/10/2015 1527   RDW 14.3 01/10/2015 1527   LYMPHSABS 2.2 01/10/2015 1527   MONOABS 0.3 01/10/2015 1527   EOSABS 0.1 01/10/2015 1527   BASOSABS 0.0 01/10/2015 1527    CMP     Component Value Date/Time   NA 140 01/10/2015 1527   K 3.7 01/10/2015 1527   CL 107 01/10/2015 1527   CO2 23 01/10/2015 1527   GLUCOSE 73 01/10/2015 1527   BUN 8 01/10/2015 1527   CREATININE 0.60 01/10/2015 1527   CREATININE 0.66 10/01/2013 1016  CALCIUM 9.4 01/10/2015 1527   PROT 7.7 10/25/2014 1100   ALBUMIN 4.3 10/25/2014 1100   AST 17 10/25/2014 1100   ALT 14 10/25/2014 1100   ALKPHOS 67 10/25/2014 1100   BILITOT 0.4 10/25/2014 1100    Lab Results  Component Value Date   CRP 0.2* 01/10/2015   CT scan abdomen and pelvis personally reviewed today, performed with contrast on 01/15/2015     Assessment & Plan:    34 year old female with past medical history of Crohn's colitis with perianal involvement status post laparoscopic total proctocolectomy with permanent end ileostomy in April 2015 is here for follow-up.  1. Crohn's colitis with perianal involvement -- status post proctocolectomy with end ileostomy. Some stomal pain and mucosal oozing. I recommended ileoscopy for direct visualization. Given her extensive history with perianal and Fistulizing Crohn's disease I recommended that we restart biologic therapy. Cimzia was stopped secondary to rash though the rash is also returned, see #2. I recommended Entyvio with induction. We discussed the risks, benefits and alternatives to this medication and she is agreeable to proceed. --Flu vaccine advised that she has already received  2. Rash -- bilateral antecubital fossa, recurrent. Dermatology referral. This was felt possibly secondary to anti-TNF therapy, but given recurrence this is felt less likely now. Await dermatology input  3. GERD -- without alarm symptoms. Zantac 75 milligrams twice a day when necessary  4. Pelvic mass -- GYN follow-up strongly encouraged. She was previously advised to see GYN but she is yet to make this appointment. I have reiterated the importance of this appointment to her today. She voices understanding  Return in 6 months, sooner if necessary

## 2015-02-17 LAB — QUANTIFERON TB GOLD ASSAY (BLOOD)
INTERFERON GAMMA RELEASE ASSAY: POSITIVE — AB
MITOGEN VALUE: 1.31 [IU]/mL
QUANTIFERON TB AG MINUS NIL: 0.66 [IU]/mL
Quantiferon Nil Value: 0.94 IU/mL
TB Ag value: 1.6 IU/mL

## 2015-02-18 ENCOUNTER — Other Ambulatory Visit: Payer: Self-pay

## 2015-02-18 DIAGNOSIS — R7612 Nonspecific reaction to cell mediated immunity measurement of gamma interferon antigen response without active tuberculosis: Secondary | ICD-10-CM

## 2015-02-20 ENCOUNTER — Other Ambulatory Visit: Payer: Self-pay

## 2015-02-20 DIAGNOSIS — R7612 Nonspecific reaction to cell mediated immunity measurement of gamma interferon antigen response without active tuberculosis: Secondary | ICD-10-CM

## 2015-02-27 ENCOUNTER — Ambulatory Visit: Payer: BLUE CROSS/BLUE SHIELD | Admitting: Internal Medicine

## 2015-03-18 ENCOUNTER — Ambulatory Visit: Payer: BLUE CROSS/BLUE SHIELD | Admitting: Infectious Diseases

## 2015-04-04 ENCOUNTER — Encounter: Payer: Self-pay | Admitting: Internal Medicine

## 2015-04-09 ENCOUNTER — Ambulatory Visit (INDEPENDENT_AMBULATORY_CARE_PROVIDER_SITE_OTHER): Payer: BLUE CROSS/BLUE SHIELD | Admitting: Infectious Diseases

## 2015-04-09 ENCOUNTER — Encounter: Payer: Self-pay | Admitting: Infectious Diseases

## 2015-04-09 ENCOUNTER — Ambulatory Visit: Payer: BLUE CROSS/BLUE SHIELD | Admitting: Infectious Diseases

## 2015-04-09 VITALS — BP 110/73 | HR 74 | Temp 97.6°F | Ht 64.0 in | Wt 127.0 lb

## 2015-04-09 DIAGNOSIS — R7611 Nonspecific reaction to tuberculin skin test without active tuberculosis: Secondary | ICD-10-CM

## 2015-04-09 DIAGNOSIS — Z227 Latent tuberculosis: Secondary | ICD-10-CM | POA: Insufficient documentation

## 2015-04-09 LAB — CBC
HCT: 36.9 % (ref 36.0–46.0)
HEMOGLOBIN: 11.7 g/dL — AB (ref 12.0–15.0)
MCH: 26.3 pg (ref 26.0–34.0)
MCHC: 31.7 g/dL (ref 30.0–36.0)
MCV: 82.9 fL (ref 78.0–100.0)
MPV: 9.9 fL (ref 8.6–12.4)
Platelets: 406 10*3/uL — ABNORMAL HIGH (ref 150–400)
RBC: 4.45 MIL/uL (ref 3.87–5.11)
RDW: 15.3 % (ref 11.5–15.5)
WBC: 6.5 10*3/uL (ref 4.0–10.5)

## 2015-04-09 MED ORDER — PYRIDOXINE HCL 50 MG PO TABS: 50.0000 mg | ORAL_TABLET | Freq: Every day | ORAL | Status: DC

## 2015-04-09 MED ORDER — ISONIAZID 300 MG PO TABS: 300.0000 mg | ORAL_TABLET | Freq: Every day | ORAL | Status: DC

## 2015-04-09 NOTE — Assessment & Plan Note (Signed)
Her risk factors are low but her risk with ant-TNF agent is very high.  Will start her on 9 months of INH and B6.  Will get LFTs today and monthly.  See her back in 2 months

## 2015-04-09 NOTE — Addendum Note (Signed)
Addended by: Deval Mroczka C on: 04/09/2015 02:30 PM   Modules accepted: Orders

## 2015-04-09 NOTE — Progress Notes (Signed)
   Subjective:    Patient ID: Tracy Solis, female    DOB: January 30, 1981, 34 y.o.   MRN: [id number]  HPI 34 yo F with past medical history of Crohn's colitis with perianal involvement status post laparoscopic total proctocolectomy with permanent end ileostomy in April 2015  She was prev on anti-TNF therapy until 11-2014 when she developed a rash.  She had quantiferon gold positive prior to starting a new rx and was told this was positive.  She is not sure why she is here.   She also has a hx of an pelvic mass, has appt next month with her GYN.   PMHx, Sochx, Fhx reviewed, updated in EPIC.   TB risk factors- no Fhx TB. 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 Never homeless, never incarcerated, never lived overseas, never been in [employer].   Review of Systems  Constitutional: Negative for fever, chills, appetite change and unexpected weight change.  Respiratory: Negative for cough and shortness of breath.   Cardiovascular: Negative for leg swelling.  Gastrointestinal: Negative for diarrhea and constipation.  Genitourinary: Negative for difficulty urinating and menstrual problem.  Neurological: Negative for headaches.  Hematological: Negative for adenopathy.       Objective:   Physical Exam  Constitutional: She appears well-developed and well-nourished.  HENT:  Mouth/Throat: No oropharyngeal exudate.  Eyes: EOM are normal. Pupils are equal, round, and reactive to light.  Neck: Neck supple.  Cardiovascular: Normal rate, regular rhythm and normal heart sounds.   Pulmonary/Chest: Effort normal and breath sounds normal.  Abdominal: Soft. Bowel sounds are normal. There is no tenderness. There is no rebound.  Musculoskeletal: She exhibits no edema or tenderness.  Lymphadenopathy:    She has no cervical adenopathy.    She has no axillary adenopathy.      Assessment & Plan:

## 2015-04-10 LAB — HIV ANTIBODY (ROUTINE TESTING W REFLEX): HIV: NONREACTIVE

## 2015-04-17 ENCOUNTER — Encounter: Payer: Self-pay | Admitting: Internal Medicine

## 2015-04-17 ENCOUNTER — Ambulatory Visit (AMBULATORY_SURGERY_CENTER): Payer: BLUE CROSS/BLUE SHIELD | Admitting: Internal Medicine

## 2015-04-17 VITALS — BP 108/72 | HR 117 | Temp 98.6°F | Resp 17 | Ht 64.0 in | Wt 126.0 lb

## 2015-04-17 DIAGNOSIS — K50919 Crohn's disease, unspecified, with unspecified complications: Secondary | ICD-10-CM | POA: Diagnosis present

## 2015-04-17 DIAGNOSIS — K5000 Crohn's disease of small intestine without complications: Secondary | ICD-10-CM | POA: Diagnosis not present

## 2015-04-17 MED ORDER — BUDESONIDE 3 MG PO CPEP: 9.0000 mg | ORAL_CAPSULE | Freq: Every day | ORAL | Status: DC

## 2015-04-17 MED ORDER — SODIUM CHLORIDE 0.9 % IV SOLN: 500.0000 mL | INTRAVENOUS | Status: DC

## 2015-04-17 NOTE — Progress Notes (Signed)
A/ox3 pleased with MAC, report to Jill RN 

## 2015-04-17 NOTE — Progress Notes (Signed)
Called to room to assist during endoscopic procedure.  Patient ID and intended procedure confirmed with present staff. Received instructions for my participation in the procedure from the performing physician.  

## 2015-04-17 NOTE — Op Note (Signed)
[facility] [address] [place], [address]   FLEXIBLE SIGMOIDOSCOPY PROCEDURE REPORT  PATIENT: Tracy Solis, Tracy Solis  MR#: [id number] BIRTHDATE: October 05, 1980 , 34  yrs. old GENDER: female ENDOSCOPIST: Beverley Fiedler, MD PROCEDURE DATE:  04/17/2015 PROCEDURE:   ileoscopy with biopsy ASA CLASS:   Class II INDICATIONS:history of Crohn's disease status post total colectomy with permanent end ileostomy, recent abdominal pain, peristomal pain and bleeding. MEDICATIONS: Monitored anesthesia care and Propofol 250 mg IV  DESCRIPTION OF PROCEDURE:   After the risks benefits and alternatives of the procedure were thoroughly explained, informed consent was obtained.  A digital exam was performed with 5th finger of the ileostomy. The LB [id number]  endoscope was introduced through the ileostomy and advanced 30-35 cm into the ileum.  The exam was without limitations.    The quality of the prep was good with MiraLax. Estimated blood loss is zero unless otherwise noted in this procedure report. The instrument was then slowly withdrawn as the mucosa was fully examined.     COLON FINDINGS: Erythema with very superficial and punctate erosion with excoriation on stomal mucosa.  Ileostomy is widely patent. For 3-4 cm from the stoma there is mild erythema and superficial erosion.  Multiple biopsies with cold forceps.  At approximately 10 cm from the stoma there was an area of ulceration, this area was also biopsied extensively.  The remaining ileal mucosa is normal in appearance and was examined approximately 30-35 cm from the stoma. Retroflexion was not performed.    The scope was then withdrawn from the patient and the procedure terminated.  COMPLICATIONS: There were no immediate complications.  ENDOSCOPIC IMPRESSION: Mild ileitis in the very distal ileum at ileostomy likely related to Crohn's disease; multiple biopsies Remaining ileal mucosa appears  normal  RECOMMENDATIONS: 1.  Await biopsies 2.  Begin budesonide 9 mg daily x 8 weeks 3.  Office follow-up with me 4.  Anticipate Entyvio induction when cleared by ID as she is being treated for latent TB   eSigned:  Beverley Fiedler, MD 04/17/2015 2:02 PM   CC: the patient, PCP, Dr. Ninetta Lights  PATIENT NAME:  Sonal, Dorwart MR#: [id number]

## 2015-04-17 NOTE — Patient Instructions (Signed)
YOU HAD AN ENDOSCOPIC PROCEDURE TODAY AT THE [facility]:   Refer to the procedure report that was given to you for any specific questions about what was found during the examination.  If the procedure report does not answer your questions, please call your gastroenterologist to clarify.  If you requested that your care partner not be given the details of your procedure findings, then the procedure report has been included in a sealed envelope for you to review at your convenience later.  YOU SHOULD EXPECT: Some feelings of bloating in the abdomen. Passage of more gas than usual.  Walking can help get rid of the air that was put into your GI tract during the procedure and reduce the bloating. If you had a lower endoscopy (such as a colonoscopy or flexible sigmoidoscopy) you may notice spotting of blood in your stool or on the toilet paper. If you underwent a bowel prep for your procedure, you may not have a normal bowel movement for a few days.  Please Note:  You might notice some irritation and congestion in your nose or some drainage.  This is from the oxygen used during your procedure.  There is no need for concern and it should clear up in a day or so.  SYMPTOMS TO REPORT IMMEDIATELY:   Following lower endoscopy (colonoscopy or flexible sigmoidoscopy):  Excessive amounts of blood in the stool  Significant tenderness or worsening of abdominal pains  Swelling of the abdomen that is new, acute  Fever of 100F or higher  For urgent or emergent issues, a gastroenterologist can be reached at any hour by calling [phone].   DIET: Your first meal following the procedure should be a small meal and then it is ok to progress to your normal diet. Heavy or fried foods are harder to digest and may make you feel nauseous or bloated.  Likewise, meals heavy in dairy and vegetables can increase bloating.  Drink plenty of fluids but you should avoid alcoholic beverages for 24  hours.  ACTIVITY:  You should plan to take it easy for the rest of today and you should NOT DRIVE or use heavy machinery until tomorrow (because of the sedation medicines used during the test).    FOLLOW UP: Our staff will call the number listed on your records the next business day following your procedure to check on you and address any questions or concerns that you may have regarding the information given to you following your procedure. If we do not reach you, we will leave a message.  However, if you are feeling well and you are not experiencing any problems, there is no need to return our call.  We will assume that you have returned to your regular daily activities without incident.  If any biopsies were taken you will be contacted by phone or by letter within the next 1-3 weeks.  Please call us at [phone] if you have not heard about the biopsies in 3 weeks.    SIGNATURES/CONFIDENTIALITY: You and/or your care partner have signed paperwork which will be entered into your electronic medical record.  These signatures attest to the fact that that the information above on your After Visit Summary has been reviewed and is understood.  Full responsibility of the confidentiality of this discharge information lies with you and/or your care-partner.  Await biopsies Medication sent to pharmacy F/u with Dr. Rhea Belton office

## 2015-04-18 ENCOUNTER — Telehealth: Payer: Self-pay

## 2015-04-18 NOTE — Telephone Encounter (Signed)
  Follow up Call-  Call back number 04/17/2015  Post procedure Call Back phone  # [phone]  Permission to leave phone message Yes     Patient questions:  Do you have a fever, pain , or abdominal swelling? No. Pain Score  0 *  Have you tolerated food without any problems? Yes.    Have you been able to return to your normal activities? Yes.    Do you have any questions about your discharge instructions: Diet   No. Medications  No. Follow up visit  No.  Do you have questions or concerns about your Care? No.  Actions: * If pain score is 4 or above: No action needed, pain <4.

## 2015-04-24 ENCOUNTER — Encounter: Payer: Self-pay | Admitting: Internal Medicine

## 2015-04-30 ENCOUNTER — Other Ambulatory Visit: Payer: Self-pay

## 2015-04-30 DIAGNOSIS — K50919 Crohn's disease, unspecified, with unspecified complications: Secondary | ICD-10-CM

## 2015-05-01 ENCOUNTER — Telehealth: Payer: Self-pay | Admitting: Internal Medicine

## 2015-05-01 NOTE — Telephone Encounter (Signed)
[facility] is verifying insurance information for [employer]

## 2015-05-21 NOTE — Progress Notes (Signed)
Tracy L Vertell Novak, RN           [name] is [phone]. Cost of infusion will range from $70-140. She has a $400 ded they will pick up 80%. She has an out of pocket max of $2000. Once she meets that they should cover 100%      Called and left message for pt to call back.  Spoke with pt and let her know the information regarding Entyvio. Pt states she wants to think about this and call us back to let us know what she wants to do.

## 2015-05-27 ENCOUNTER — Telehealth: Payer: Self-pay | Admitting: Internal Medicine

## 2015-05-27 NOTE — Telephone Encounter (Signed)
Pt states she wants to discuss other options that may be available to her other than Entyvio. Pt states she will talk to Dr. Rhea Belton at her OV at the end of February.

## 2015-05-28 NOTE — Telephone Encounter (Signed)
[facility] has called to state that they have tried to reach the patient numerous times; the patients insurance has expired.

## 2015-06-17 ENCOUNTER — Other Ambulatory Visit (INDEPENDENT_AMBULATORY_CARE_PROVIDER_SITE_OTHER): Payer: BLUE CROSS/BLUE SHIELD

## 2015-06-17 ENCOUNTER — Encounter: Payer: Self-pay | Admitting: Internal Medicine

## 2015-06-17 ENCOUNTER — Ambulatory Visit (INDEPENDENT_AMBULATORY_CARE_PROVIDER_SITE_OTHER): Payer: BLUE CROSS/BLUE SHIELD | Admitting: Internal Medicine

## 2015-06-17 VITALS — BP 98/70 | HR 80 | Ht 65.0 in | Wt 129.2 lb

## 2015-06-17 DIAGNOSIS — Z939 Artificial opening status, unspecified: Secondary | ICD-10-CM

## 2015-06-17 DIAGNOSIS — Z79899 Other long term (current) drug therapy: Secondary | ICD-10-CM | POA: Diagnosis not present

## 2015-06-17 DIAGNOSIS — K50019 Crohn's disease of small intestine with unspecified complications: Secondary | ICD-10-CM

## 2015-06-17 DIAGNOSIS — Z87898 Personal history of other specified conditions: Secondary | ICD-10-CM

## 2015-06-17 LAB — CBC WITH DIFFERENTIAL/PLATELET
BASOS ABS: 0.1 10*3/uL (ref 0.0–0.1)
Basophils Relative: 1.3 % (ref 0.0–3.0)
EOS ABS: 0.1 10*3/uL (ref 0.0–0.7)
Eosinophils Relative: 2.2 % (ref 0.0–5.0)
HCT: 37.9 % (ref 36.0–46.0)
HEMOGLOBIN: 12.3 g/dL (ref 12.0–15.0)
LYMPHS PCT: 32.9 % (ref 12.0–46.0)
Lymphs Abs: 2 10*3/uL (ref 0.7–4.0)
MCHC: 32.5 g/dL (ref 30.0–36.0)
MCV: 80.6 fl (ref 78.0–100.0)
MONO ABS: 0.3 10*3/uL (ref 0.1–1.0)
Monocytes Relative: 4.8 % (ref 3.0–12.0)
Neutro Abs: 3.6 10*3/uL (ref 1.4–7.7)
Neutrophils Relative %: 58.8 % (ref 43.0–77.0)
Platelets: 302 10*3/uL (ref 150.0–400.0)
RBC: 4.71 Mil/uL (ref 3.87–5.11)
RDW: 14.7 % (ref 11.5–15.5)
WBC: 6.2 10*3/uL (ref 4.0–10.5)

## 2015-06-17 LAB — COMPREHENSIVE METABOLIC PANEL
ALBUMIN: 4.4 g/dL (ref 3.5–5.2)
ALT: 11 U/L (ref 0–35)
AST: 19 U/L (ref 0–37)
Alkaline Phosphatase: 76 U/L (ref 39–117)
BILIRUBIN TOTAL: 0.3 mg/dL (ref 0.2–1.2)
BUN: 9 mg/dL (ref 6–23)
CHLORIDE: 106 meq/L (ref 96–112)
CO2: 23 mEq/L (ref 19–32)
CREATININE: 0.57 mg/dL (ref 0.40–1.20)
Calcium: 9.6 mg/dL (ref 8.4–10.5)
GFR: 155.13 mL/min (ref >60.00)
Glucose, Bld: 81 mg/dL (ref 70–99)
Potassium: 4.1 mEq/L (ref 3.5–5.1)
SODIUM: 138 meq/L (ref 135–145)
TOTAL PROTEIN: 8.1 g/dL (ref 6.0–8.3)

## 2015-06-17 LAB — HIGH SENSITIVITY CRP: CRP HIGH SENSITIVITY: 2.47 mg/L (ref 0.000–5.000)

## 2015-06-17 MED ORDER — AZATHIOPRINE 50 MG PO TABS: 50.0000 mg | ORAL_TABLET | Freq: Every day | ORAL | Status: AC

## 2015-06-17 NOTE — Patient Instructions (Signed)
Tracy Solis with Dr Tyrone Apple office states that she will call you with an appointment for follow up.  Please follow up with Dr Rhea Belton in 4 months.  We have sent the following medications to your pharmacy for you to pick up at your convenience: CBC, CMP, CRP, TPMT  We have sent the following medications to your pharmacy for you to pick up at your convenience: Azathioprine 50 mg daily  Please have your labs drawn again on 07/01/15 so we can adjust your medication accordingly.

## 2015-06-17 NOTE — Progress Notes (Signed)
Subjective:    Patient ID: Tracy Solis, female    DOB: 1981/02/04, 35 y.o.   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 Here today with her son. Last in the office in October 2016 but came for ileoscopy to evaluate bleeding per ostomy and intermittent peristomal pain.  This procedure was done on 04/17/2015. Finding showed mild ileitis in the very distal ileum at ileostomy which was biopsied. There were punctate erosions and excoriation particularly on the stomal mucosa. Ileostomy was widely patent. Biopsies showed active ileitis with focal erosion. The microscopic histologic impression was felt compatible with clinical impression of Crohn's. 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 Ninetta Lights with ID. There are plans for 9 months of therapy.  Today she reports she has her good and bad days. Her bad days are primarily when she feels "sluggish and weak" with low energy and stamina. Ostomy output has been good but occasional scant bleeding is noted. Some peristomal pain which comes and goes but not worsening. No other abdominal pain. Appetite has been good. Rash resolved with discontinuation of Cimzia. Less issues with GERD and indigestion recently.   Review of Systems As per history of present illness, otherwise negative  Current Medications, Allergies, Past Medical History, Past Surgical History, Family History and Social History were reviewed in [employer] record.     Objective:   Physical Exam BP 98/70 mmHg  Pulse 80  Ht  (1.651 m)  Wt 129 lb 3.2 oz (58.605 kg)  BMI 21.50 kg/m2  LMP 05/30/2015 Constitutional: Well-developed and well-nourished. No distress. HEENT: Normocephalic and  atraumatic. Marland Kitchen Conjunctivae are normal.  No scleral icterus. Neck: Neck supple. Trachea midline. Cardiovascular: Normal rate, regular rhythm and intact distal pulses. No M/R/G Pulmonary/chest: Effort normal and breath sounds normal. No wheezing, rales or rhonchi. Abdominal: Soft, nontender, nondistended. Bowel sounds active throughout. Ostomy right lower quadrant intact Extremities: no clubbing, cyanosis, or edema Neurological: Alert and oriented to person place and time. Skin: Skin is warm and dry. No rashes noted. Psychiatric: Normal mood and affect. Behavior is normal.  CBC    Component Value Date/Time   WBC 6.2 06/17/2015 0945   RBC 4.71 06/17/2015 0945   HGB 12.3 06/17/2015 0945   HCT 37.9 06/17/2015 0945   PLT 302.0 06/17/2015 0945   MCV 80.6 06/17/2015 0945   MCH 26.3 04/09/2015 1508   MCHC 32.5 06/17/2015 0945   RDW 14.7 06/17/2015 0945   LYMPHSABS 2.0 06/17/2015 0945   MONOABS 0.3 06/17/2015 0945   EOSABS 0.1 06/17/2015 0945   BASOSABS 0.1 06/17/2015 0945    CMP     Component Value Date/Time   NA 138 06/17/2015 0945   K 4.1 06/17/2015 0945   CL 106 06/17/2015 0945   CO2 23 06/17/2015 0945   GLUCOSE 81 06/17/2015 0945   BUN 9 06/17/2015 0945   CREATININE 0.57 06/17/2015 0945   CREATININE 0.66 10/01/2013 1016   CALCIUM 9.6 06/17/2015 0945   PROT 8.1 06/17/2015 0945   ALBUMIN 4.4 06/17/2015 0945   AST 19 06/17/2015 0945   ALT 11 06/17/2015 0945   ALKPHOS 76 06/17/2015 0945   BILITOT 0.3 06/17/2015 0945   hsCRP 2.4 (last 0.8)     Assessment & Plan:  35 year old female with history of Crohn's colitis with perianal involvement 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  1. Crohn's colitis with history of perianal involvement/status post proctocolectomy with permanent end ileostomy/Crohn's of distal ileum and ostomy -- small bowel involvement is new for her but documented recently at  ileoscopy. She's had some bleeding felt secondary to Crohn's in the very distal ileum. This is minor bleeding and not resulting in anemia. I recommended that we escalate Crohn's therapy given active inflammation at the ostomy and her history. Cimzia caused a rash and was discontinued. She has previous exposure to Remicade and Humira. My recommendation was for [facility] or Stelara, which we discussed at length today. She has a big problem with self injection and a fear of needles. She states that mentally she is not ready for biologic therapy at present. She does have history of taking Imuran which she tolerated. We discussed how Imuran is not as effective as biologic therapy for small bowel Crohn's though it does have some activity. After discussion she prefers a trial of azathioprine. Begin azathioprine 50 mg daily with goal to increase to 125 mg daily. She understands she will need serial CBC and hepatic function panel on this medication. Repeat TPMT for completeness.    Follow-up in 4 months, sooner if necessary 25 minutes spent with the patient today. Greater than 50% was spent in counseling and coordination of care with the patient

## 2015-06-21 LAB — THIOPURINE METHYLTRANSFERASE (TPMT), RBC: Thiopurine Methyltransferase, RBC: 14 nmol/h/mL

## 2015-06-24 ENCOUNTER — Other Ambulatory Visit: Payer: Self-pay

## 2015-06-24 DIAGNOSIS — K50919 Crohn's disease, unspecified, with unspecified complications: Secondary | ICD-10-CM

## 2015-06-24 MED ORDER — AZATHIOPRINE 50 MG PO TABS: 125.0000 mg | ORAL_TABLET | Freq: Every day | ORAL | Status: DC

## 2015-07-07 ENCOUNTER — Telehealth: Payer: Self-pay

## 2015-07-07 NOTE — Telephone Encounter (Signed)
Pt aware.

## 2015-07-07 NOTE — Telephone Encounter (Signed)
-----   Message from Chrystie Nose, RN sent at 06/24/2015 11:26 AM EST ----- Regarding: Labs Pt needs labs 07/09/15, orders in epic.

## 2015-07-13 ENCOUNTER — Other Ambulatory Visit: Payer: Self-pay | Admitting: Internal Medicine

## 2015-07-17 ENCOUNTER — Other Ambulatory Visit (INDEPENDENT_AMBULATORY_CARE_PROVIDER_SITE_OTHER): Payer: BLUE CROSS/BLUE SHIELD

## 2015-07-17 DIAGNOSIS — K50919 Crohn's disease, unspecified, with unspecified complications: Secondary | ICD-10-CM

## 2015-07-17 DIAGNOSIS — Z227 Latent tuberculosis: Secondary | ICD-10-CM

## 2015-07-17 LAB — CBC WITH DIFFERENTIAL/PLATELET
BASOS PCT: 0.1 % (ref 0.0–3.0)
Basophils Absolute: 0 10*3/uL (ref 0.0–0.1)
EOS ABS: 0.2 10*3/uL (ref 0.0–0.7)
Eosinophils Relative: 2.7 % (ref 0.0–5.0)
HCT: 35.3 % — ABNORMAL LOW (ref 36.0–46.0)
Hemoglobin: 11.4 g/dL — ABNORMAL LOW (ref 12.0–15.0)
LYMPHS ABS: 1.5 10*3/uL (ref 0.7–4.0)
LYMPHS PCT: 23.7 % (ref 12.0–46.0)
MCHC: 32.2 g/dL (ref 30.0–36.0)
MCV: 81.5 fl (ref 78.0–100.0)
MONO ABS: 0.3 10*3/uL (ref 0.1–1.0)
Monocytes Relative: 4.2 % (ref 3.0–12.0)
NEUTROS ABS: 4.5 10*3/uL (ref 1.4–7.7)
NEUTROS PCT: 69.3 % (ref 43.0–77.0)
PLATELETS: 389 10*3/uL (ref 150.0–400.0)
RBC: 4.33 Mil/uL (ref 3.87–5.11)
RDW: 14.9 % (ref 11.5–15.5)
WBC: 6.4 10*3/uL (ref 4.0–10.5)

## 2015-07-17 LAB — COMPREHENSIVE METABOLIC PANEL
ALT: 13 U/L (ref 0–35)
AST: 17 U/L (ref 0–37)
Albumin: 4.2 g/dL (ref 3.5–5.2)
Alkaline Phosphatase: 74 U/L (ref 39–117)
BUN: 13 mg/dL (ref 6–23)
CHLORIDE: 106 meq/L (ref 96–112)
CO2: 27 meq/L (ref 19–32)
CREATININE: 0.5 mg/dL (ref 0.40–1.20)
Calcium: 9.4 mg/dL (ref 8.4–10.5)
GFR: 180.37 mL/min (ref >60.00)
Glucose, Bld: 85 mg/dL (ref 70–99)
POTASSIUM: 3.7 meq/L (ref 3.5–5.1)
SODIUM: 140 meq/L (ref 135–145)
Total Bilirubin: 0.2 mg/dL (ref 0.2–1.2)
Total Protein: 7.6 g/dL (ref 6.0–8.3)

## 2015-07-19 LAB — QUANTIFERON TB GOLD ASSAY (BLOOD)
INTERFERON GAMMA RELEASE ASSAY: NEGATIVE
MITOGEN-NIL SO: 4.57 [IU]/mL
QUANTIFERON TB AG MINUS NIL: 0.21 [IU]/mL
Quantiferon Nil Value: 0.21 IU/mL

## 2015-07-22 ENCOUNTER — Other Ambulatory Visit: Payer: Self-pay | Admitting: Internal Medicine

## 2015-10-07 ENCOUNTER — Other Ambulatory Visit: Payer: Self-pay

## 2015-10-07 ENCOUNTER — Telehealth: Payer: Self-pay | Admitting: Internal Medicine

## 2015-10-07 DIAGNOSIS — K50919 Crohn's disease, unspecified, with unspecified complications: Secondary | ICD-10-CM

## 2015-10-07 MED ORDER — PANTOPRAZOLE SODIUM 40 MG PO TBEC: 40.0000 mg | DELAYED_RELEASE_TABLET | Freq: Every day | ORAL | Status: AC

## 2015-10-07 NOTE — Telephone Encounter (Signed)
Spoke with pt and she is aware. Script sent to pharmacy and lab orders in epic.

## 2015-10-07 NOTE — Telephone Encounter (Signed)
Please have her come in for CBC, CMP and thiopurine metabolite panel to assess the dose of her azathioprine Can discontinue Carafate and Zantac and begin pantoprazole 40 mg every morning 30 minutes before breakfast. Zantac abused in the evening at 150 mg on an as-needed basis

## 2015-10-07 NOTE — Telephone Encounter (Signed)
Pt states she is having more issues with reflux. Reports when she breaths in through her mouth her chest even burns. Pt states the carafate is not helping and she tried zantac otc and it is not helping. Also reports she is still having the pain in her stoma for about 3-4 days. States that it comes and goes. Pt hopes this will just stop soon like it has in the past. Please advise.

## 2016-08-11 IMAGING — CT CT ABD-PELV W/ CM
2 of 4 series · 16 of 46 positions shown, 18 images · IV contrast (Omnipaque 300)
Comparison: None

CLINICAL DATA: Pain at stoma.  History of Crohn's ileo colitis.

EXAM:
CT ABDOMEN AND PELVIS WITH CONTRAST
TECHNIQUE: Multidetector CT imaging of the abdomen and pelvis was performed
using the standard protocol following bolus administration of
intravenous contrast.
CONTRAST:  100mL OMNIPAQUE IOHEXOL 300 MG/ML  SOLN

[Series 2: abd/ pel 5mm · axial · 0.73mm/px · z∈[-728,-334]mm · 13 of 87 slices shown, 15 images]
[im 4/87  soft-tissue]
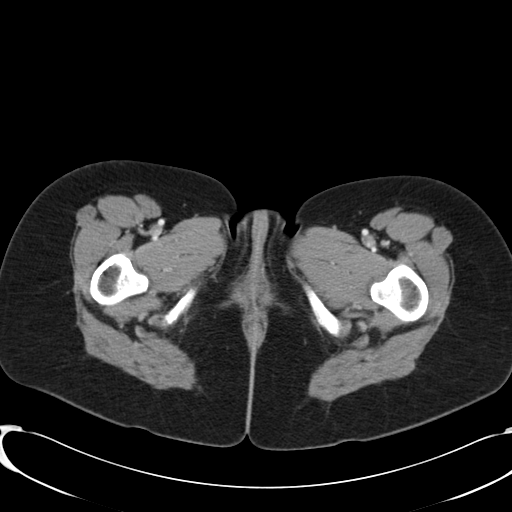
[im 4/87  bone]
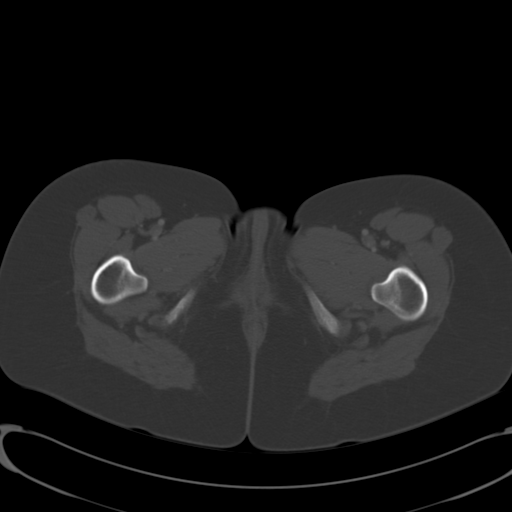
[im 11/87  soft-tissue]
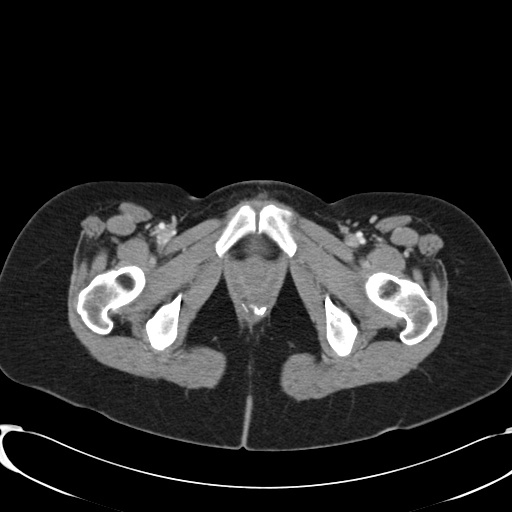
[im 18/87  soft-tissue]
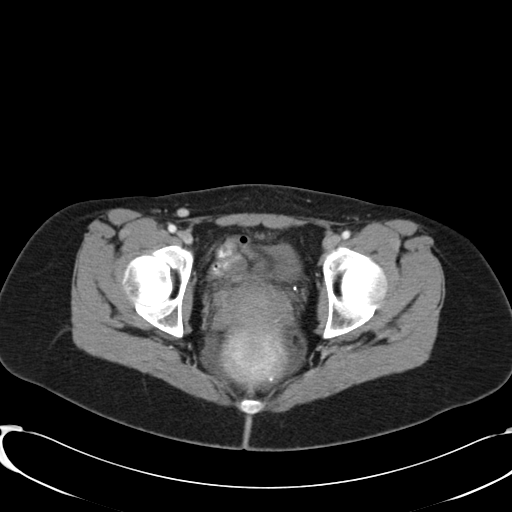
[im 25/87  soft-tissue]
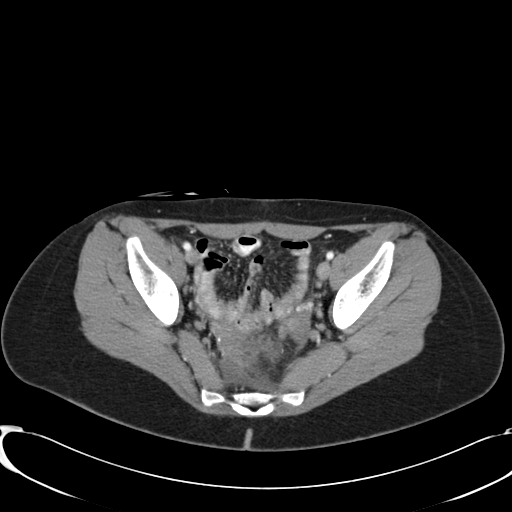
[im 31/87  soft-tissue]
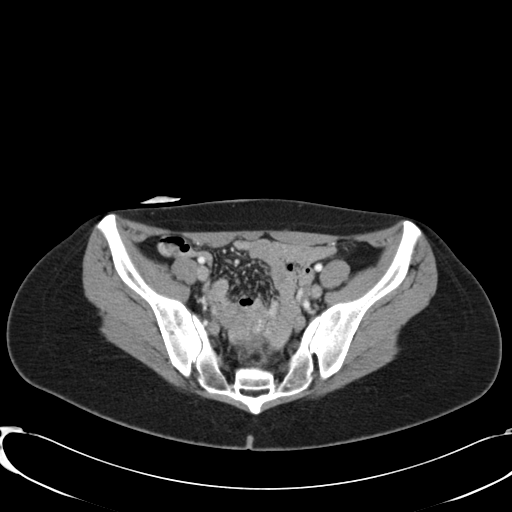
[im 38/87  soft-tissue]
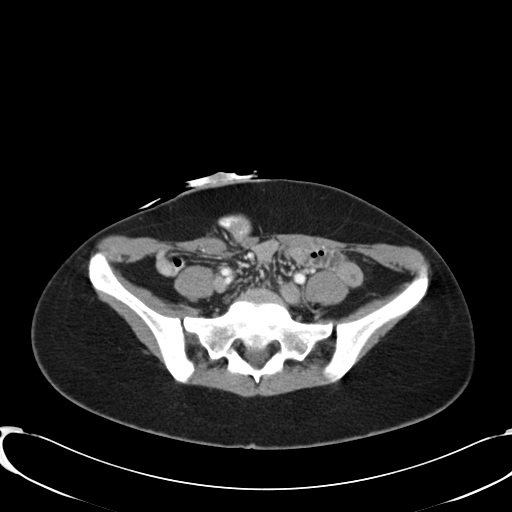
[im 45/87  soft-tissue]
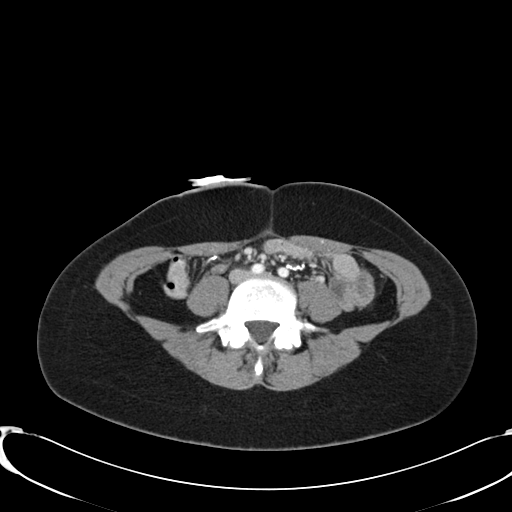
[im 49/87  soft-tissue]
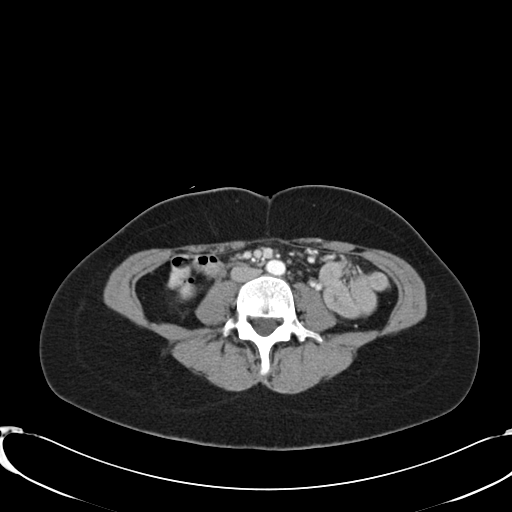
[im 56/87  soft-tissue]
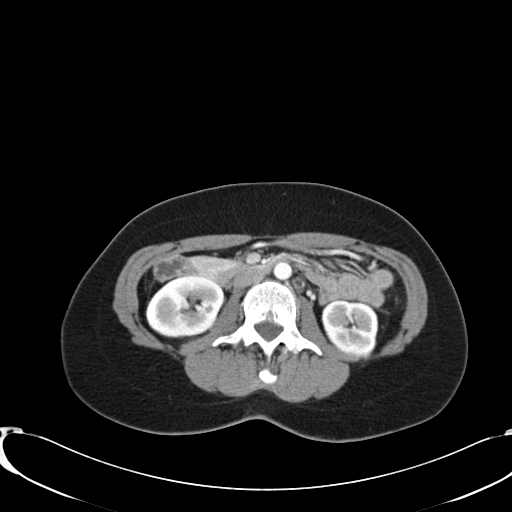
[im 56/87  bone]
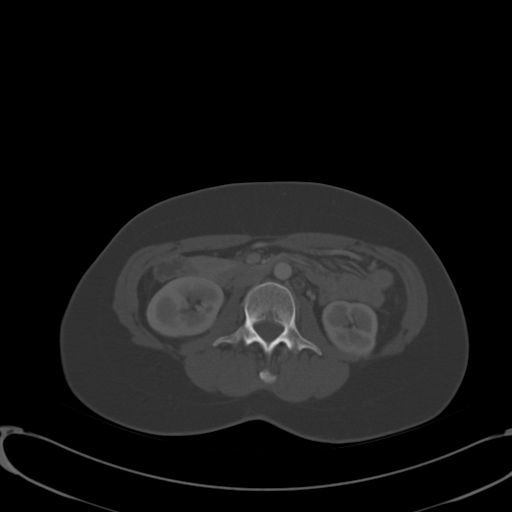
[im 62/87  soft-tissue]
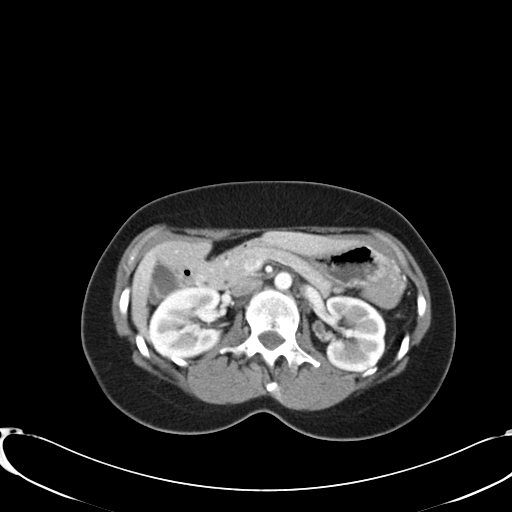
[im 69/87  soft-tissue]
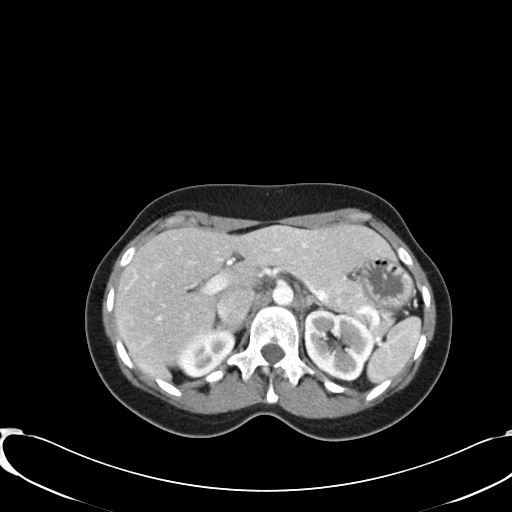
[im 76/87  soft-tissue]
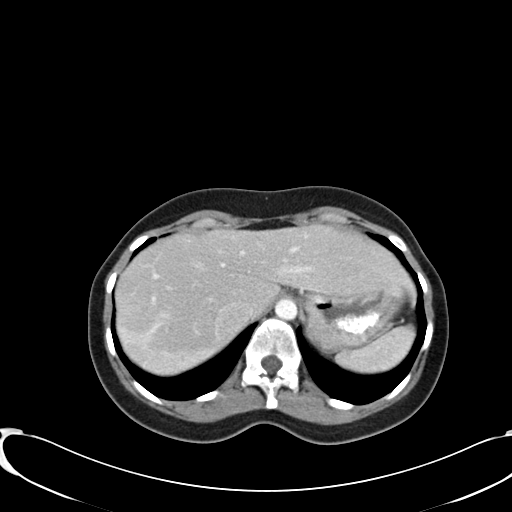
[im 83/87  soft-tissue]
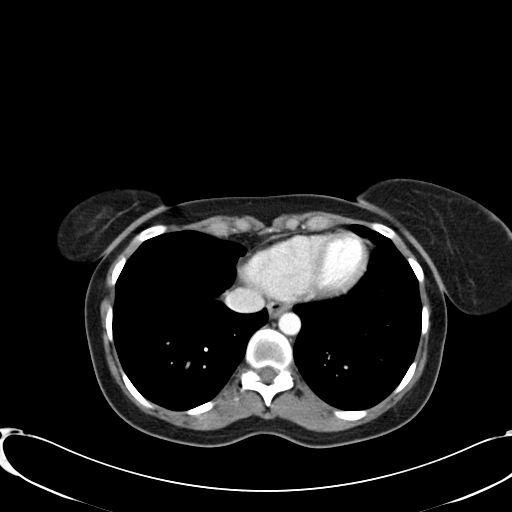

[Series 602: coronal · coronal · 0.87mm/px · 3 of 64 slices shown]
[im 22/64  soft-tissue]
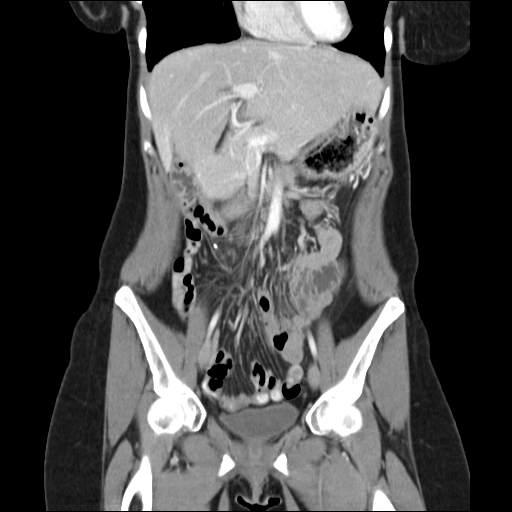
[im 29/64  soft-tissue]
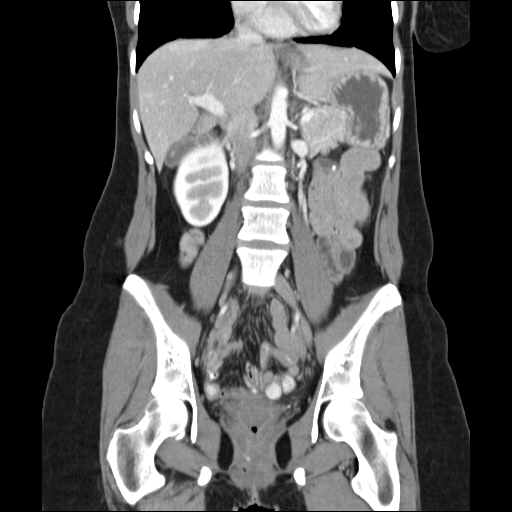
[im 36/64  soft-tissue]
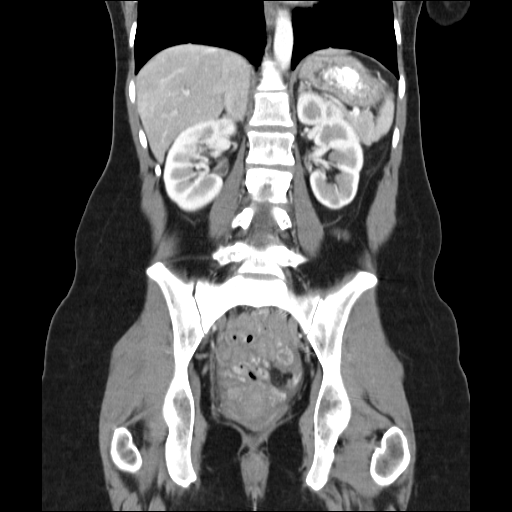

[16 of 46 positions shown; findings below may reference images not displayed]

FINDINGS: Lower chest: No pleural effusion identified. The lung bases are
clear.

Hepatobiliary: No suspicious liver abnormality identified. The
gallbladder appears normal.

Pancreas: On unremarkable appearance of the pancreas.

Spleen: The spleen is negative.

Adrenals/Urinary Tract: The adrenal glands are unremarkable. Normal
appearance of both kidneys. The urinary bladder appears normal.

Stomach/Bowel: Normal appearance of the stomach. The small bowel
loops have a normal caliber. There is no evidence for bowel
obstruction. No specific findings identified to suggest active small
bowel inflammation. Ileostomy site within the right lower quadrant
of the abdomen is identified and appears patent. There is a small
lymph node within the ileostomy site which measures 7 mm. No
parastomal hernia or fluid collection identified to account for the
patient's pain. Status post colectomy.

Vascular/Lymphatic: Normal appearance of the abdominal aorta. No
enlarged retroperitoneal or mesenteric adenopathy. No enlarged
pelvic or inguinal lymph nodes.

Reproductive: The uterus appears within normal limits. There is a
mass within the pelvis measuring 4.8 cm. This is predominantly fat
attenuation with a central area of ossification compatible with a
dermoid. It is unclear whether this is arising from the left or
right adnexa.

Other: Trace amount of free fluid noted within the dependent portion
of the pelvis. Nonspecific. No fluid collections identified.

Musculoskeletal: Mild curvature of the thoracic and lumbar spine is
convex towards the left. No acute bone abnormalities noted.
IMPRESSION: 1. No complications identified associated with right lower quadrant
ileostomy site.
2. Pelvic mass is identified compatible with ovarian dermoid. It is
unclear from today's study whether this is arising from the left or
right ovary. This could be further assessed with pelvic sonogram.
3. Small amount of free fluid noted within the pelvis. This may be
physiologic.
